# Patient Record
Sex: Male | Born: 1995 | Race: White | Hispanic: No | Marital: Single | Smoking: Never smoker
Health system: Southern US, Community
[De-identification: ages and names within clinical notes are randomized; demographics above are authoritative.]

## PROBLEM LIST (undated history)

## (undated) DIAGNOSIS — K219 Gastro-esophageal reflux disease without esophagitis: Secondary | ICD-10-CM

## (undated) DIAGNOSIS — S83511A Sprain of anterior cruciate ligament of right knee, initial encounter: Secondary | ICD-10-CM

## (undated) HISTORY — PX: ROOT CANAL: SHX2363

---
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
## 2016-01-22 DIAGNOSIS — H5213 Myopia, bilateral: Secondary | ICD-10-CM | POA: Diagnosis not present

## 2016-06-07 DIAGNOSIS — J029 Acute pharyngitis, unspecified: Secondary | ICD-10-CM | POA: Diagnosis not present

## 2016-06-07 DIAGNOSIS — J101 Influenza due to other identified influenza virus with other respiratory manifestations: Secondary | ICD-10-CM | POA: Diagnosis not present

## 2016-06-26 DIAGNOSIS — S838X1A Sprain of other specified parts of right knee, initial encounter: Secondary | ICD-10-CM | POA: Diagnosis not present

## 2016-06-30 DIAGNOSIS — S83511A Sprain of anterior cruciate ligament of right knee, initial encounter: Secondary | ICD-10-CM

## 2016-06-30 HISTORY — DX: Sprain of anterior cruciate ligament of right knee, initial encounter: S83.511A

## 2016-07-01 DIAGNOSIS — M546 Pain in thoracic spine: Secondary | ICD-10-CM | POA: Diagnosis not present

## 2016-07-02 DIAGNOSIS — M546 Pain in thoracic spine: Secondary | ICD-10-CM | POA: Diagnosis not present

## 2016-07-03 ENCOUNTER — Encounter (HOSPITAL_BASED_OUTPATIENT_CLINIC_OR_DEPARTMENT_OTHER): Payer: Self-pay | Admitting: *Deleted

## 2016-07-06 ENCOUNTER — Ambulatory Visit: Payer: Self-pay | Admitting: Physician Assistant

## 2016-07-06 NOTE — H&P (Signed)
Dylan Peterson is an 20 y.o. male.   Chief Complaint: right knee ACL tear HPI: patient was running down the basketball court on 06/25/16 felt a pop and had immediate pain was seen in orthopaedic urgent care mri ordered.  MRI confirms complete tear of the ACL with questionable meniscal pathology laterally.  Past Medical History:  Diagnosis Date  . GERD (gastroesophageal reflux disease)    no current med.  . Right ACL tear 06/2016    Past Surgical History:  Procedure Laterality Date  . ARM HARDWARE REMOVAL Right 01/18/2006   humerus  . CLOSED REDUCTION AND PERCUTANEOUS PINNING OF HUMERUS FRACTURE Right 12/20/2005  . INGUINAL HERNIA REPAIR Right 08/03/2001    Family History  Problem Relation Age of Onset  . Alcohol abuse Mother   . Drug abuse Mother   . Depression Mother   . Alcohol abuse Paternal Uncle   . Prostate cancer Maternal Grandfather   . Hypertension Maternal Grandfather   . Diabetes Maternal Grandfather    Social History:  reports that he has never smoked. He has never used smokeless tobacco. He reports that he does not drink alcohol or use drugs.  Allergies: No Known Allergies   (Not in a hospital admission)  No results found for this or any previous visit (from the past 48 hour(s)). No results found.  Review of Systems  Gastrointestinal: Positive for heartburn.  Musculoskeletal: Positive for falls and joint pain.  All other systems reviewed and are negative.   There were no vitals taken for this visit. Physical Exam  Constitutional: He is oriented to person, place, and time. He appears well-developed and well-nourished. No distress.  HENT:  Head: Normocephalic and atraumatic.  Nose: Nose normal.  Eyes: Conjunctivae and EOM are normal. Pupils are equal, round, and reactive to light.  Neck: Normal range of motion. Neck supple.  Cardiovascular: Normal rate and intact distal pulses.   Respiratory: Effort normal. No respiratory distress.  GI: Soft. He  exhibits no distension. There is no tenderness.  Musculoskeletal:       Right knee: He exhibits swelling and effusion. He exhibits no erythema, no LCL laxity and no MCL laxity. Tenderness found.  + lachman,   Neurological: He is alert and oriented to person, place, and time.  Skin: Skin is warm and dry. No rash noted. No erythema.  Psychiatric: He has a normal mood and affect. His behavior is normal.     Assessment/Plan He is a candidate for an autograft reconstruction. I told him my experience with using the patellar tendon. 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 He will need a physical therapy referral after surgery. He will need a CPM. This will be performed as an outpatient with a femoral nerve block. Risks and benefits discussed in general with the patient who is an adult with one of his parents present. Proceed on with scheduling as soon as practicable. He is totally disabled at this time.   Devaun Hernandez, PA-C 07/06/2016, 12:18 PM   

## 2016-07-08 ENCOUNTER — Encounter (HOSPITAL_BASED_OUTPATIENT_CLINIC_OR_DEPARTMENT_OTHER): Payer: Self-pay | Admitting: *Deleted

## 2016-07-08 ENCOUNTER — Ambulatory Visit (HOSPITAL_BASED_OUTPATIENT_CLINIC_OR_DEPARTMENT_OTHER): Payer: 59 | Admitting: Anesthesiology

## 2016-07-08 ENCOUNTER — Encounter (HOSPITAL_BASED_OUTPATIENT_CLINIC_OR_DEPARTMENT_OTHER)
Admission: RE | Disposition: A | Discharge status: Home / Self Care | Payer: Self-pay | Source: Ambulatory Visit | Attending: Orthopedic Surgery

## 2016-07-08 ENCOUNTER — Ambulatory Visit (HOSPITAL_BASED_OUTPATIENT_CLINIC_OR_DEPARTMENT_OTHER)
Admission: RE | Admit: 2016-07-08 | Discharge: 2016-07-08 | Disposition: A | Discharge status: Home / Self Care | Payer: 59 | Source: Ambulatory Visit | Attending: Orthopedic Surgery | Admitting: Orthopedic Surgery

## 2016-07-08 DIAGNOSIS — S83511A Sprain of anterior cruciate ligament of right knee, initial encounter: Secondary | ICD-10-CM | POA: Diagnosis not present

## 2016-07-08 DIAGNOSIS — S83512A Sprain of anterior cruciate ligament of left knee, initial encounter: Secondary | ICD-10-CM | POA: Diagnosis not present

## 2016-07-08 DIAGNOSIS — Y9302 Activity, running: Secondary | ICD-10-CM | POA: Diagnosis not present

## 2016-07-08 DIAGNOSIS — M25572 Pain in left ankle and joints of left foot: Secondary | ICD-10-CM | POA: Diagnosis not present

## 2016-07-08 DIAGNOSIS — M545 Low back pain: Secondary | ICD-10-CM | POA: Diagnosis not present

## 2016-07-08 DIAGNOSIS — Y92310 Basketball court as the place of occurrence of the external cause: Secondary | ICD-10-CM | POA: Insufficient documentation

## 2016-07-08 DIAGNOSIS — G8918 Other acute postprocedural pain: Secondary | ICD-10-CM | POA: Diagnosis not present

## 2016-07-08 HISTORY — DX: Sprain of anterior cruciate ligament of right knee, initial encounter: S83.511A

## 2016-07-08 HISTORY — DX: Gastro-esophageal reflux disease without esophagitis: K21.9

## 2016-07-08 HISTORY — PX: KNEE ARTHROSCOPY WITH ANTERIOR CRUCIATE LIGAMENT (ACL) REPAIR: SHX5644

## 2016-07-08 SURGERY — KNEE ARTHROSCOPY WITH ANTERIOR CRUCIATE LIGAMENT (ACL) REPAIR
Anesthesia: General | Site: Knee | Laterality: Right

## 2016-07-08 MED ORDER — OXYCODONE-ACETAMINOPHEN 5-325 MG PO TABS
ORAL_TABLET | ORAL | Status: AC
  Filled 2016-07-08: qty 1

## 2016-07-08 MED ORDER — BUPIVACAINE-EPINEPHRINE (PF) 0.5% -1:200000 IJ SOLN
INTRAMUSCULAR | Status: DC | PRN
  Administered 2016-07-08: 25 mL via PERINEURAL

## 2016-07-08 MED ORDER — DEXAMETHASONE SODIUM PHOSPHATE 4 MG/ML IJ SOLN
INTRAMUSCULAR | Status: DC | PRN
  Administered 2016-07-08: 10 mg via INTRAVENOUS

## 2016-07-08 MED ORDER — LIDOCAINE 2% (20 MG/ML) 5 ML SYRINGE
INTRAMUSCULAR | Status: AC
  Filled 2016-07-08: qty 5

## 2016-07-08 MED ORDER — LIDOCAINE 2% (20 MG/ML) 5 ML SYRINGE
INTRAMUSCULAR | Status: DC | PRN
  Administered 2016-07-08: 20 mg via INTRAVENOUS

## 2016-07-08 MED ORDER — HYDROMORPHONE HCL 1 MG/ML IJ SOLN
0.2500 mg | INTRAMUSCULAR | Status: DC | PRN
  Administered 2016-07-08 (×2): 0.5 mg via INTRAVENOUS

## 2016-07-08 MED ORDER — CHLORHEXIDINE GLUCONATE 4 % EX LIQD: 60.0000 mL | Freq: Once | CUTANEOUS | Status: DC

## 2016-07-08 MED ORDER — PROMETHAZINE HCL 25 MG/ML IJ SOLN: 6.2500 mg | INTRAMUSCULAR | Status: DC | PRN

## 2016-07-08 MED ORDER — SODIUM CHLORIDE 0.9 % IR SOLN
Status: DC | PRN
  Administered 2016-07-08: 16000 mL

## 2016-07-08 MED ORDER — SODIUM CHLORIDE 0.9 % IV SOLN: INTRAVENOUS | Status: DC

## 2016-07-08 MED ORDER — ONDANSETRON HCL 4 MG/2ML IJ SOLN
INTRAMUSCULAR | Status: AC
  Filled 2016-07-08: qty 2

## 2016-07-08 MED ORDER — HYDROMORPHONE HCL 1 MG/ML IJ SOLN
INTRAMUSCULAR | Status: AC
  Filled 2016-07-08: qty 1

## 2016-07-08 MED ORDER — FENTANYL CITRATE (PF) 100 MCG/2ML IJ SOLN
50.0000 ug | INTRAMUSCULAR | Status: AC | PRN
  Administered 2016-07-08 (×3): 50 ug via INTRAVENOUS
  Administered 2016-07-08 (×2): 25 ug via INTRAVENOUS
  Administered 2016-07-08: 100 ug via INTRAVENOUS

## 2016-07-08 MED ORDER — ONDANSETRON HCL 4 MG/2ML IJ SOLN
INTRAMUSCULAR | Status: DC | PRN
  Administered 2016-07-08: 4 mg via INTRAVENOUS

## 2016-07-08 MED ORDER — PROPOFOL 10 MG/ML IV BOLUS
INTRAVENOUS | Status: DC | PRN
  Administered 2016-07-08: 200 mg via INTRAVENOUS

## 2016-07-08 MED ORDER — OXYCODONE-ACETAMINOPHEN 5-325 MG PO TABS
1.0000 | ORAL_TABLET | Freq: Once | ORAL | Status: AC
Start: 2016-07-08 — End: 2016-07-08
  Administered 2016-07-08: 1 via ORAL

## 2016-07-08 MED ORDER — MIDAZOLAM HCL 2 MG/2ML IJ SOLN
INTRAMUSCULAR | Status: AC
  Filled 2016-07-08: qty 2

## 2016-07-08 MED ORDER — FENTANYL CITRATE (PF) 100 MCG/2ML IJ SOLN
INTRAMUSCULAR | Status: AC
  Filled 2016-07-08: qty 2

## 2016-07-08 MED ORDER — CEFAZOLIN SODIUM-DEXTROSE 2-4 GM/100ML-% IV SOLN
INTRAVENOUS | Status: AC
  Filled 2016-07-08: qty 100

## 2016-07-08 MED ORDER — MIDAZOLAM HCL 2 MG/2ML IJ SOLN
1.0000 mg | INTRAMUSCULAR | Status: DC | PRN
  Administered 2016-07-08 (×2): 2 mg via INTRAVENOUS

## 2016-07-08 MED ORDER — PROPOFOL 10 MG/ML IV BOLUS
INTRAVENOUS | Status: AC
  Filled 2016-07-08: qty 40

## 2016-07-08 MED ORDER — SCOPOLAMINE 1 MG/3DAYS TD PT72: 1.0000 | MEDICATED_PATCH | Freq: Once | TRANSDERMAL | Status: DC | PRN

## 2016-07-08 MED ORDER — CEFAZOLIN SODIUM-DEXTROSE 2-4 GM/100ML-% IV SOLN
2.0000 g | INTRAVENOUS | Status: AC
  Administered 2016-07-08: 2 g via INTRAVENOUS

## 2016-07-08 MED ORDER — FENTANYL CITRATE (PF) 100 MCG/2ML IJ SOLN
INTRAMUSCULAR | Status: AC
Start: 2016-07-08 — End: 2016-07-08
  Filled 2016-07-08: qty 2

## 2016-07-08 MED ORDER — LACTATED RINGERS IV SOLN
INTRAVENOUS | Status: DC
  Administered 2016-07-08 (×3): via INTRAVENOUS

## 2016-07-08 SURGICAL SUPPLY — 91 items
ANCH SUT PUSHLCK 19.5X3.5 STRL (Anchor) ×1 IMPLANT
ANCHOR PUSHLOCK PEEK 3.5X19.5 (Anchor) ×2 IMPLANT
APL SKNCLS STERI-STRIP NONHPOA (GAUZE/BANDAGES/DRESSINGS) ×1
BANDAGE ACE 4X5 VEL STRL LF (GAUZE/BANDAGES/DRESSINGS) ×3 IMPLANT
BANDAGE ACE 6X5 VEL STRL LF (GAUZE/BANDAGES/DRESSINGS) ×3 IMPLANT
BANDAGE ESMARK 6X9 LF (GAUZE/BANDAGES/DRESSINGS) IMPLANT
BENZOIN TINCTURE PRP APPL 2/3 (GAUZE/BANDAGES/DRESSINGS) ×3 IMPLANT
BLADE 4.2CUDA (BLADE) ×3 IMPLANT
BLADE AVERAGE 25MMX9MM (BLADE)
BLADE AVERAGE 25X9 (BLADE) IMPLANT
BLADE CUDA 5.5 (BLADE) IMPLANT
BLADE CUDA GRT WHITE 3.5 (BLADE) IMPLANT
BLADE CUDA SHAVER 3.5 (BLADE) IMPLANT
BLADE CUTTER MENIS 5.5 (BLADE) IMPLANT
BLADE GREAT WHITE 4.2 (BLADE) IMPLANT
BLADE GREAT WHITE 4.2MM (BLADE)
BLADE OSCIL/SAGITTAL W/10 ST (BLADE) ×1 IMPLANT
BLADE OSCIL/SAGITTAL W/10MM ST (BLADE) ×1
BLADE SURG 10 STRL SS (BLADE) ×3 IMPLANT
BLADE SURG 15 STRL LF DISP TIS (BLADE) ×1 IMPLANT
BLADE SURG 15 STRL SS (BLADE) ×3
BNDG CMPR 9X6 STRL LF SNTH (GAUZE/BANDAGES/DRESSINGS) ×1
BNDG ESMARK 6X9 LF (GAUZE/BANDAGES/DRESSINGS) ×3
BUR EGG 3PK/BX (BURR) ×2 IMPLANT
BUR VERTEX HOODED 4.5 (BURR) ×3 IMPLANT
CLOSURE WOUND 1/2 X4 (GAUZE/BANDAGES/DRESSINGS) ×1
COVER BACK TABLE 60X90IN (DRAPES) ×3 IMPLANT
CUFF TOURNIQUET SINGLE 34IN LL (TOURNIQUET CUFF) ×1 IMPLANT
CUTTER MENISCUS  4.2MM (BLADE)
CUTTER MENISCUS 4.2MM (BLADE) IMPLANT
DRAPE ARTHROSCOPY W/POUCH 114 (DRAPES) ×3 IMPLANT
DRAPE IMP U-DRAPE 54X76 (DRAPES) ×2 IMPLANT
DRAPE INCISE IOBAN 66X45 STRL (DRAPES) ×3 IMPLANT
DRSG EMULSION OIL 3X3 NADH (GAUZE/BANDAGES/DRESSINGS) ×6 IMPLANT
DURAPREP 26ML APPLICATOR (WOUND CARE) ×3 IMPLANT
ELECT REM PT RETURN 9FT ADLT (ELECTROSURGICAL) ×3
ELECTRODE REM PT RTRN 9FT ADLT (ELECTROSURGICAL) IMPLANT
GAUZE SPONGE 4X4 12PLY STRL (GAUZE/BANDAGES/DRESSINGS) ×3 IMPLANT
GLOVE BIO SURGEON STRL SZ7.5 (GLOVE) ×6 IMPLANT
GLOVE BIOGEL PI IND STRL 7.0 (GLOVE) IMPLANT
GLOVE BIOGEL PI IND STRL 8 (GLOVE) ×3 IMPLANT
GLOVE BIOGEL PI INDICATOR 7.0 (GLOVE) ×2
GLOVE BIOGEL PI INDICATOR 8 (GLOVE) ×6
GLOVE ECLIPSE 6.5 STRL STRAW (GLOVE) ×2 IMPLANT
GLOVE SURG ORTHO 8.0 STRL STRW (GLOVE) ×3 IMPLANT
GLOVE SURG SS PI 6.5 STRL IVOR (GLOVE) ×2 IMPLANT
GOWN STRL REUS W/ TWL LRG LVL3 (GOWN DISPOSABLE) ×1 IMPLANT
GOWN STRL REUS W/ TWL XL LVL3 (GOWN DISPOSABLE) ×2 IMPLANT
GOWN STRL REUS W/TWL LRG LVL3 (GOWN DISPOSABLE) ×3
GOWN STRL REUS W/TWL XL LVL3 (GOWN DISPOSABLE) ×6
IMMOBILIZER KNEE 22 UNIV (SOFTGOODS) IMPLANT
IMMOBILIZER KNEE 24 THIGH 36 (MISCELLANEOUS) IMPLANT
IMMOBILIZER KNEE 24 UNIV (MISCELLANEOUS) ×3
KIT TRANSTIBIAL (DISPOSABLE) ×1 IMPLANT
KNEE WRAP E Z 3 GEL PACK (MISCELLANEOUS) ×3 IMPLANT
KNIFE GRAFT ACL 10MM 5952 (MISCELLANEOUS) IMPLANT
MANIFOLD NEPTUNE II (INSTRUMENTS) ×3 IMPLANT
NDL SAFETY ECLIPSE 18X1.5 (NEEDLE) ×1 IMPLANT
NEEDLE HYPO 18GX1.5 SHARP (NEEDLE) ×3
PACK ARTHROSCOPY DSU (CUSTOM PROCEDURE TRAY) ×3 IMPLANT
PACK BASIN DAY SURGERY FS (CUSTOM PROCEDURE TRAY) ×3 IMPLANT
PASSER SUT SWANSON 36MM LOOP (INSTRUMENTS) IMPLANT
PENCIL BUTTON HOLSTER BLD 10FT (ELECTRODE) ×2 IMPLANT
PROBE BIPOLAR ARTHRO 85MM 30D (MISCELLANEOUS) IMPLANT
PROBE BIPOLAR ATHRO 135MM 90D (MISCELLANEOUS) ×2 IMPLANT
SCREW INTERFERENCE 7X25MM (Screw) ×2 IMPLANT
SCREW INTERFERENCE 8X20MM (Screw) ×2 IMPLANT
SET ARTHROSCOPY TUBING (MISCELLANEOUS) ×3
SET ARTHROSCOPY TUBING LN (MISCELLANEOUS) ×1 IMPLANT
SPONGE LAP 4X18 X RAY DECT (DISPOSABLE) ×3 IMPLANT
STRIP CLOSURE SKIN 1/2X4 (GAUZE/BANDAGES/DRESSINGS) ×2 IMPLANT
SUCTION FRAZIER HANDLE 10FR (MISCELLANEOUS) ×2
SUCTION TUBE FRAZIER 10FR DISP (MISCELLANEOUS) ×1 IMPLANT
SUT 2 FIBERLOOP 20 STRT BLUE (SUTURE)
SUT BONE WAX W31G (SUTURE) IMPLANT
SUT FIBERWIRE #2 38 T-5 BLUE (SUTURE) ×12
SUT MNCRL AB 3-0 PS2 18 (SUTURE) ×3 IMPLANT
SUT PDS 2 CP NEEDLE XSPECIAL (SUTURE) ×6 IMPLANT
SUT VIC AB 0 CT1 27 (SUTURE)
SUT VIC AB 0 CT1 27XBRD ANBCTR (SUTURE) IMPLANT
SUT VIC AB 2-0 CT1 27 (SUTURE) ×3
SUT VIC AB 2-0 CT1 TAPERPNT 27 (SUTURE) ×1 IMPLANT
SUT VIC AB 3-0 SH 27 (SUTURE)
SUT VIC AB 3-0 SH 27X BRD (SUTURE) IMPLANT
SUTURE 2 FIBERLOOP 20 STRT BLU (SUTURE) IMPLANT
SUTURE FIBERWR #2 38 T-5 BLUE (SUTURE) IMPLANT
SYR 5ML LL (SYRINGE) ×3 IMPLANT
TOWEL OR 17X24 6PK STRL BLUE (TOWEL DISPOSABLE) ×6 IMPLANT
TOWEL OR NON WOVEN STRL DISP B (DISPOSABLE) ×3 IMPLANT
WATER STERILE IRR 1000ML POUR (IV SOLUTION) ×3 IMPLANT
YANKAUER SUCT BULB TIP NO VENT (SUCTIONS) ×3 IMPLANT

## 2016-07-08 NOTE — Interval H&P Note (Signed)
History and Physical Interval Note:  07/08/2016 1:16 PM  Dylan Peterson  has presented today for surgery, with the diagnosis of right knee acl tear  The various methods of treatment have been discussed with the patient and family. After consideration of risks, benefits and other options for treatment, the patient has consented to  Procedure(s): RIGHT KNEE ARTHROSCOPY WITH ANTERIOR CRUCIATE LIGAMENT (ACL) REPAIR (Right) as a surgical intervention .  The patient's history has been reviewed, patient examined, no change in status, stable for surgery.  I have reviewed the patient's chart and labs.  Questions were answered to the patient's satisfaction.     Angeleah Labrake JR,W D

## 2016-07-08 NOTE — Brief Op Note (Signed)
07/08/2016  3:37 PM  PATIENT:  Ave Filterhandler T Olaes  21 y.o. male  PRE-OPERATIVE DIAGNOSIS:  right knee acl tear  POST-OPERATIVE DIAGNOSIS:  right knee acl tear  PROCEDURE:  Procedure(s): RIGHT KNEE ARTHROSCOPY WITH ANTERIOR CRUCIATE LIGAMENT (ACL) REPAIR with patellar tendon autograft (Right)  SURGEON:  Surgeon(s) and Role:    Frederico Hamman* Caffrey, Daniel, MD - Primary  PHYSICIAN ASSISTANT: Margart SicklesJoshua Clairissa Valvano, PA-C  ASSISTANTS:   ANESTHESIA:   regional and general  EBL:  Total I/O In: 1600 [I.V.:1600] Out: 100 [Blood:100]  BLOOD ADMINISTERED:none  DRAINS: none   LOCAL MEDICATIONS USED:  NONE  SPECIMEN:  No Specimen  DISPOSITION OF SPECIMEN:  N/A  COUNTS:  YES  TOURNIQUET:  * No tourniquets in log *  DICTATION: .Other Dictation: Dictation Number unknown  PLAN OF CARE: Discharge to home after PACU  PATIENT DISPOSITION:  PACU - hemodynamically stable.   Delay start of Pharmacological VTE agent (>24hrs) due to surgical blood loss or risk of bleeding: not applicable

## 2016-07-08 NOTE — Transfer of Care (Signed)
Immediate Anesthesia Transfer of Care Note  Patient: Dylan Peterson  Procedure(s) Performed: Procedure(s): RIGHT KNEE ARTHROSCOPY WITH ANTERIOR CRUCIATE LIGAMENT (ACL) REPAIR with patellar tendon autograft (Right)  Patient Location: PACU  Anesthesia Type:GA combined with regional for post-op pain  Level of Consciousness: sedated  Airway & Oxygen Therapy: Patient Spontanous Breathing and Patient connected to face mask oxygen  Post-op Assessment: Report given to RN and Post -op Vital signs reviewed and stable  Post vital signs: Reviewed and stable  Last Vitals:  Vitals:   07/08/16 1200 07/08/16 1543  BP: (!) 114/59   Pulse: 69   Resp: 16   Temp:  (P) 36.4 C    Last Pain:  Vitals:   07/08/16 1543  TempSrc:   PainSc: (P) Asleep         Complications: No apparent anesthesia complications

## 2016-07-08 NOTE — Anesthesia Procedure Notes (Addendum)
Anesthesia Regional Block: Femoral nerve block   Pre-Anesthetic Checklist: ,, timeout performed, Correct Patient, Correct Site, Correct Laterality, Correct Procedure, Correct Position, site marked, Risks and benefits discussed,  Surgical consent,  Pre-op evaluation,  At surgeon's request and post-op pain management  Laterality: Right  Prep: chloraprep       Needles:  Injection technique: Single-shot  Needle Type: Echogenic Stimulator Needle     Needle Length: 9cm  Needle Gauge: 21     Additional Needles:   Procedures: ultrasound guided,,,,,,,,   Nerve Stimulator or Paresthesia:  Response: quad, 0.5 mA,   Additional Responses:   Narrative:  Start time: 07/08/2016 11:45 AM End time: 07/08/2016 11:52 AM Injection made incrementally with aspirations every 5 mL.  Performed by: Personally  Anesthesiologist: Marcene DuosFITZGERALD, Wolfe Camarena

## 2016-07-08 NOTE — Anesthesia Procedure Notes (Signed)
Procedure Name: LMA Insertion Date/Time: 07/08/2016 1:29 PM Performed by: Tyrone NineSAUVE, Quintus Premo F Pre-anesthesia Checklist: Patient identified, Timeout performed, Emergency Drugs available, Suction available and Patient being monitored Patient Re-evaluated:Patient Re-evaluated prior to inductionOxygen Delivery Method: Circle system utilized Preoxygenation: Pre-oxygenation with 100% oxygen Intubation Type: IV induction Ventilation: Mask ventilation without difficulty LMA: LMA inserted LMA Size: 4.0 Number of attempts: 1 Placement Confirmation: breath sounds checked- equal and bilateral and positive ETCO2 Tube secured with: Tape Dental Injury: Teeth and Oropharynx as per pre-operative assessment

## 2016-07-08 NOTE — Progress Notes (Signed)
Assisted Dr. Rob Fitzgerald with right, ultrasound guided, femoral block. Side rails up, monitors on throughout procedure. See vital signs in flow sheet. Tolerated Procedure well. 

## 2016-07-08 NOTE — Anesthesia Postprocedure Evaluation (Signed)
Anesthesia Post Note  Patient: Dylan Peterson  Procedure(s) Performed: Procedure(s) (LRB): RIGHT KNEE ARTHROSCOPY WITH ANTERIOR CRUCIATE LIGAMENT (ACL) REPAIR with patellar tendon autograft (Right)  Patient location during evaluation: PACU Anesthesia Type: General Level of consciousness: awake and alert Pain management: pain level controlled Vital Signs Assessment: post-procedure vital signs reviewed and stable Respiratory status: spontaneous breathing, nonlabored ventilation, respiratory function stable and patient connected to nasal cannula oxygen Cardiovascular status: blood pressure returned to baseline and stable Postop Assessment: no signs of nausea or vomiting Anesthetic complications: no       Last Vitals:  Vitals:   07/08/16 1630 07/08/16 1639  BP: 123/64 126/70  Pulse: 60 (!) 55  Resp: 15 16  Temp:  36.6 C    Last Pain:  Vitals:   07/08/16 1639  TempSrc: Oral  PainSc: 5                  Kennieth RadFitzgerald, Melisse Caetano E

## 2016-07-08 NOTE — Anesthesia Preprocedure Evaluation (Addendum)
Anesthesia Evaluation  Patient identified by MRN, date of birth, ID band Patient awake    Reviewed: Allergy & Precautions, NPO status , Patient's Chart, lab work & pertinent test results  Airway Mallampati: II  TM Distance: >3 FB Neck ROM: Full    Dental  (+) Dental Advisory Given, Teeth Intact   Pulmonary neg pulmonary ROS,    breath sounds clear to auscultation       Cardiovascular Exercise Tolerance: Good negative cardio ROS   Rhythm:Regular Rate:Normal     Neuro/Psych negative neurological ROS  negative psych ROS   GI/Hepatic negative GI ROS, Neg liver ROS,   Endo/Other  negative endocrine ROS  Renal/GU negative Renal ROS  negative genitourinary   Musculoskeletal Right knee ACL tear   Abdominal   Peds negative pediatric ROS (+)  Hematology negative hematology ROS (+)   Anesthesia Other Findings   Reproductive/Obstetrics negative OB ROS                          Anesthesia Physical Anesthesia Plan  ASA: I  Anesthesia Plan: General   Post-op Pain Management:  Regional for Post-op pain   Induction: Intravenous  Airway Management Planned: LMA  Additional Equipment:   Intra-op Plan:   Post-operative Plan: Extubation in OR  Informed Consent: I have reviewed the patients History and Physical, chart, labs and discussed the procedure including the risks, benefits and alternatives for the proposed anesthesia with the patient or authorized representative who has indicated his/her understanding and acceptance.   Dental advisory given  Plan Discussed with: CRNA  Anesthesia Plan Comments:        Anesthesia Quick Evaluation

## 2016-07-08 NOTE — H&P (View-Only) (Signed)
Dylan Peterson is an 21 y.o. male.   Chief Complaint: right knee ACL tear HPI: patient was running down the basketball court on 06/25/16 felt a pop and had immediate pain was seen in orthopaedic urgent care mri ordered.  MRI confirms complete tear of the ACL with questionable meniscal pathology laterally.  Past Medical History:  Diagnosis Date  . GERD (gastroesophageal reflux disease)    no current med.  . Right ACL tear 06/2016    Past Surgical History:  Procedure Laterality Date  . ARM HARDWARE REMOVAL Right 01/18/2006   humerus  . CLOSED REDUCTION AND PERCUTANEOUS PINNING OF HUMERUS FRACTURE Right 12/20/2005  . INGUINAL HERNIA REPAIR Right 08/03/2001    Family History  Problem Relation Age of Onset  . Alcohol abuse Mother   . Drug abuse Mother   . Depression Mother   . Alcohol abuse Paternal Uncle   . Prostate cancer Maternal Grandfather   . Hypertension Maternal Grandfather   . Diabetes Maternal Grandfather    Social History:  reports that he has never smoked. He has never used smokeless tobacco. He reports that he does not drink alcohol or use drugs.  Allergies: No Known Allergies   (Not in a hospital admission)  No results found for this or any previous visit (from the past 48 hour(s)). No results found.  Review of Systems  Gastrointestinal: Positive for heartburn.  Musculoskeletal: Positive for falls and joint pain.  All other systems reviewed and are negative.   There were no vitals taken for this visit. Physical Exam  Constitutional: He is oriented to person, place, and time. He appears well-developed and well-nourished. No distress.  HENT:  Head: Normocephalic and atraumatic.  Nose: Nose normal.  Eyes: Conjunctivae and EOM are normal. Pupils are equal, round, and reactive to light.  Neck: Normal range of motion. Neck supple.  Cardiovascular: Normal rate and intact distal pulses.   Respiratory: Effort normal. No respiratory distress.  GI: Soft. He  exhibits no distension. There is no tenderness.  Musculoskeletal:       Right knee: He exhibits swelling and effusion. He exhibits no erythema, no LCL laxity and no MCL laxity. Tenderness found.  + lachman,   Neurological: He is alert and oriented to person, place, and time.  Skin: Skin is warm and dry. No rash noted. No erythema.  Psychiatric: He has a normal mood and affect. His behavior is normal.     Assessment/Plan He is a candidate for an autograft reconstruction. I told him my experience with using the patellar tendon. 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 He will need a physical therapy referral after surgery. He will need a CPM. This will be performed as an outpatient with a femoral nerve block. Risks and benefits discussed in general with the patient who is an adult with one of his parents present. Proceed on with scheduling as soon as practicable. He is totally disabled at this time.   Margart SicklesChadwell, Janice Bodine, PA-C 07/06/2016, 12:18 PM

## 2016-07-08 NOTE — Discharge Instructions (Signed)
Diet: As you were doing prior to hospitalization   Activity: Increase activity slowly as tolerated  No lifting or driving for 6 weeks  May come out of knee immobilizer range of motion as tolerated except when walking.  Shower: May shower without a dressing on post op day #3, NO SOAKING in tub   Dressing: You may change your dressing on post op day #3.  Then change the dressing daily with sterile 4"x4"s gauze dressing    Weight Bearing: weight bearing as tolerated in knee immobilizer.  To prevent constipation: you may use a stool softener such as -  Colace ( over the counter) 100 mg by mouth twice a day  Drink plenty of fluids ( prune juice may be helpful) and high fiber foods  Miralax ( over the counter) for constipation as needed.   Precautions: If you experience chest pain or shortness of breath - call 911 immediately For transfer to the hospital emergency department!!  If you develop a fever greater that 101 F, purulent drainage from wound, increased redness or drainage from wound, or calf pain -- Call the office   Follow- Up Appointment: Please call for an appointment to be seen in 1 week  [place] - [phone]   Post Anesthesia Home Care Instructions  Activity: Get plenty of rest for the remainder of the day. A responsible individual must stay with you for 24 hours following the procedure.  For the next 24 hours, DO NOT: -Drive a car -Advertising copywriter -Drink alcoholic beverages -Take any medication unless instructed by your physician -Make any legal decisions or sign important papers.  Meals: Start with liquid foods such as gelatin or soup. Progress to regular foods as tolerated. Avoid greasy, spicy, heavy foods. If nausea and/or vomiting occur, drink only clear liquids until the nausea and/or vomiting subsides. Call your physician if vomiting continues.  Special Instructions/Symptoms: Your throat may feel dry or sore from the anesthesia or the breathing tube  placed in your throat during surgery. If this causes discomfort, gargle with warm salt water. The discomfort should disappear within 24 hours.  If you had a scopolamine patch placed behind your ear for the management of post- operative nausea and/or vomiting:  1. The medication in the patch is effective for 72 hours, after which it should be removed.  Wrap patch in a tissue and discard in the trash. Wash hands thoroughly with soap and water. 2. You may remove the patch earlier than 72 hours if you experience unpleasant side effects which may include dry mouth, dizziness or visual disturbances. 3. Avoid touching the patch. Wash your hands with soap and water after contact with the patch.   Regional Anesthesia Blocks  1. Numbness or the inability to move the "blocked" extremity may last from 3-48 hours after placement. The length of time depends on the medication injected and your individual response to the medication. If the numbness is not going away after 48 hours, call your surgeon.  2. The extremity that is blocked will need to be protected until the numbness is gone and the  Strength has returned. Because you cannot feel it, you will need to take extra care to avoid injury. Because it may be weak, you may have difficulty moving it or using it. You may not know what position it is in without looking at it while the block is in effect.  3. For blocks in the legs and feet, returning to weight bearing and walking needs to be done carefully.  You will need to wait until the numbness is entirely gone and the strength has returned. You should be able to move your leg and foot normally before you try and bear weight or walk. You will need someone to be with you when you first try to ensure you do not fall and possibly risk injury.  4. Bruising and tenderness at the needle site are common side effects and will resolve in a few days.  5. Persistent numbness or new problems with movement should be  communicated to the surgeon or the [facility] [phone])/ [facility] [phone]).

## 2016-07-09 ENCOUNTER — Encounter (HOSPITAL_BASED_OUTPATIENT_CLINIC_OR_DEPARTMENT_OTHER): Payer: Self-pay | Admitting: Orthopedic Surgery

## 2016-07-09 DIAGNOSIS — M23612 Other spontaneous disruption of anterior cruciate ligament of left knee: Secondary | ICD-10-CM | POA: Diagnosis not present

## 2016-07-09 NOTE — Op Note (Signed)
NAME:  ,                                 ACCOUNT NO.:  MEDICAL RECORD NO.:  [id number]  LOCATION:                                 FACILITY:  PHYSICIAN:  Dyke BrackettW. D. Divina Neale, M.D.         DATE OF BIRTH:  DATE OF PROCEDURE:  07/08/2016 DATE OF DISCHARGE:                              OPERATIVE REPORT   INDICATIONS:  A 21 year old isolated ACL tear thought to be amenable to surgery.  PREOPERATIVE DIAGNOSIS:  Complete anterior cruciate ligament tear, right knee.  POSTOPERATIVE DIAGNOSIS:  Complete anterior cruciate ligament tear, right knee.  OPERATION:  Bone-tendon-bone autograft reconstruction, right knee (Arthrex 25 x 7 mm screw, femur; 8 x 20 screw, tibia with additional fixation of 3.5 PushLock on tibia).  SURGEON:  Dyke BrackettW. D. Steadman Prosperi, M.D.  ASSISTANT:  Margart SicklesJoshua Chadwell, PA-C.  ANESTHESIA:  Femoral nerve block, general anesthetic.  DESCRIPTION OF PROCEDURE:  No tourniquet.  Examination under anesthesia showed a 4+ pivot shift and Lachman.  He was arthroscoped through an inferior medial and inferior lateral portal.  Intra-articular examination confirmed a complete ACL tear with absence of any chondral meniscal pathology.  We harvested a 115 mm long autograft tendon with a 25 mm bone plug placed on the femoral side at 20 mm on the tibial side. We harvested bone graft as well to place in the patellar defect for closure later.  I then placed a guide pin in the anatomic attachment side at approximately 50-55 mm tunnel length of the tibia.  Over reamed this to 10 mm, followed by 10 mm reaming in the 11 o'clock position for the right knee, confirmed the tunnel to be well within bone with good posterior wall.  We then passed the graft from tibial through femoral tunnels and fixed it with metal screws, specifically a 25 x 7 mm screw on the femur and an 8 x 20 on the tibia.  There was slight overhang of a portion of the graft on the tibia.  We elected to back the tibial screw up with a  PushLock placed through 1 of the sutures through the drill hole on the patellar bone plug.  Lachman which was 4+ was trace to negligible to 1+ max.  There was no evidence of graft impingement as well.  Closure was affected after bone grafting the patella with 0 Vicryl, 2-0 Vicryl, and Monocryl.  Lightly compressive sterile dressing, knee immobilizer applied.  Taken to recovery room in stable condition.     Dyke BrackettW. D. Ekin Pilar, M.D.    WDC/MEDQ  D:  07/08/2016  T:  07/09/2016  Job:  [id number]

## 2016-07-13 ENCOUNTER — Encounter: Payer: Self-pay | Admitting: Physical Therapy

## 2016-07-13 ENCOUNTER — Ambulatory Visit: Payer: 59 | Attending: Orthopedic Surgery | Admitting: Physical Therapy

## 2016-07-13 DIAGNOSIS — M25661 Stiffness of right knee, not elsewhere classified: Secondary | ICD-10-CM | POA: Diagnosis not present

## 2016-07-13 DIAGNOSIS — R2241 Localized swelling, mass and lump, right lower limb: Secondary | ICD-10-CM | POA: Diagnosis not present

## 2016-07-13 DIAGNOSIS — R262 Difficulty in walking, not elsewhere classified: Secondary | ICD-10-CM | POA: Insufficient documentation

## 2016-07-13 DIAGNOSIS — M25561 Pain in right knee: Secondary | ICD-10-CM | POA: Insufficient documentation

## 2016-07-13 NOTE — Therapy (Signed)
[facility] [address] [place], [place], [address] Phone: [phone]   Fax:  [phone]  Physical Therapy Evaluation  Patient Details  Name: Dylan Peterson MRN: [id number] Date of Birth: 01-28-1996 Referring Provider: York Grice  Encounter Date: 07/13/2016      PT End of Session - 07/13/16 0958    Visit Number 1   Date for PT Re-Evaluation 09/12/16   PT Start Time 0922   PT Stop Time 1020   PT Time Calculation (min) 58 min   Activity Tolerance Patient tolerated treatment well;Patient limited by pain   Behavior During Therapy [facility] for tasks assessed/performed      Past Medical History:  Diagnosis Date  . GERD (gastroesophageal reflux disease)    no current med.  . Right ACL tear 06/2016    Past Surgical History:  Procedure Laterality Date  . ARM HARDWARE REMOVAL Right 01/18/2006   humerus  . CLOSED REDUCTION AND PERCUTANEOUS PINNING OF HUMERUS FRACTURE Right 12/20/2005  . INGUINAL HERNIA REPAIR Right 08/03/2001  . KNEE ARTHROSCOPY WITH ANTERIOR CRUCIATE LIGAMENT (ACL) REPAIR Right 07/08/2016   Procedure: RIGHT KNEE ARTHROSCOPY WITH ANTERIOR CRUCIATE LIGAMENT (ACL) REPAIR with patellar tendon autograft;  Surgeon: Frederico Hamman, MD;  Location: [facility];  Service: Orthopedics;  Laterality: Right;    There were no vitals filed for this visit.       Subjective Assessment - 07/13/16 0923    Subjective Patient reports that injured his right knee playing basketball on the first of May, he underwent a right autgraft ACL repair on 07/08/16.  He reports a lot of pain and is using the CPM everyday.     Patient Stated Goals run and have no pain   Currently in Pain? Yes   Pain Score 7    Pain Location Knee   Pain Orientation Right   Pain Descriptors / Indicators Aching;Tightness;Throbbing   Pain Type Surgical pain   Pain Onset In the past 7 days   Pain Frequency Constant   Aggravating  Factors  using the CPM, trying to walk on it, trying to bend it, pain up to 9/10   Pain Relieving Factors meds help, ice helps  at best pain a 3/10   Effect of Pain on Daily Activities difficulty with ADL's, hard to walk and bend            [facility] PT Assessment - 07/13/16 0001      Assessment   Medical Diagnosis s/p right ACL repair   Referring Provider Cafferey   Onset Date/Surgical Date 07/08/16   Prior Therapy none     Precautions   Precaution Comments follow ACL protocol     Restrictions   Other Position/Activity Restrictions WBAT     Balance Screen   Has the patient fallen in the past 6 months No   Has the patient had a decrease in activity level because of a fear of falling?  No   Is the patient reluctant to leave their home because of a fear of falling?  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#, climb ladders, squatting   Leisure played basketball, golf, was playing basketball for [facility] [facility] [employer]     Observation/Other Assessments-Edema    Edema Circumferential  Circumferential Edema   Circumferential - Right 41cm mid patella   Circumferential - Left  36 cm     ROM / Strength   AROM / PROM / Strength AROM;PROM;Strength     AROM   AROM Assessment Site Knee   Right/Left Knee Right   Right Knee Extension 50  has no active quad, very painful   Right Knee Flexion 50     PROM   PROM Assessment Site Knee   Right/Left Knee Right   Right Knee Extension 0   Right Knee Flexion 50     Strength   Overall Strength Comments not assessed     Palpation   Palpation comment significant swelling of the right knee, ballotable patella     Ambulation/Gait   Gait Comments two crutches and immobilizer, typically is non weight bearing for speed, had him try single crutch with weight bearing as  tolerated, needed cue to do correctly                   [facility] Treatment/Exercise - 07/13/16 0001      Modalities   Modalities Vasopneumatic     Vasopneumatic   Number Minutes Vasopneumatic  15 minutes   Vasopnuematic Location  Knee   Vasopneumatic Pressure Medium   Vasopneumatic Temperature  35                PT Education - 07/13/16 0957    Education provided Yes   Education Details QS, SAQ, heel slides   Person(s) Educated Patient;Parent(s)   Methods Explanation;Demonstration;Handout   Comprehension Verbalized understanding;Returned demonstration;Verbal cues required;Tactile cues required          PT Short Term Goals - 07/13/16 1001      PT SHORT TERM GOAL #1   Title independent iwth initial HEP   Time 2   Period Weeks   Status New           PT Long Term Goals - 07/13/16 1001      PT LONG TERM GOAL #1   Title increase AROM of the right knee to 0-120 degrees flexion   Time 12   Period Weeks   Status New     PT LONG TERM GOAL #2   Title demonstrate stregnth of the right knee a 4+/5   Time 12   Period Weeks   Status New     PT LONG TERM GOAL #3   Title walk without assistive device all distances with minimal deviation   Time 12   Period Weeks   Status New     PT LONG TERM GOAL #4   Title decrease pain 50%   Time 12   Period Weeks   Status New               Plan - 07/13/16 0958    Clinical Impression Statement Patient hurt his right knee while playing basketball on May 1, he underwent a right ACL repair on 07/08/16.  He reports that he has had a lot of pain since the surgery, he has an immbolizer on and for the most part is non weightbearing.  His ROM actively was 0, his leg was at 50degrees flexion and he could not move it into flexion or extension, PROM was 0-50 degrees with pain for flexion.  swelling is 5cm bigger on the right   Rehab Potential Good   PT Frequency 2x / week   PT Duration 8 weeks   PT  Treatment/Interventions ADLs/Self  Care Home Management;Cryotherapy;Electrical Stimulation;Gait training;Stair training;Functional mobility training;Patient/family education;Balance training;Neuromuscular re-education;Therapeutic exercise;Therapeutic activities;Visual/perceptual remediation/compensation;Manual techniques   PT Next Visit Plan follow ACL protocol   Consulted and Agree with Plan of Care Patient      Patient will benefit from skilled therapeutic intervention in order to improve the following deficits and impairments:  Abnormal gait, Decreased activity tolerance, Decreased balance, Decreased endurance, Decreased mobility, Decreased strength, Increased edema, Pain, Decreased range of motion, Difficulty walking  Visit Diagnosis: Acute pain of right knee - Plan: PT plan of care cert/re-cert  Stiffness of right knee, not elsewhere classified - Plan: PT plan of care cert/re-cert  Difficulty in walking, not elsewhere classified - Plan: PT plan of care cert/re-cert  Localized swelling, mass and lump, right lower limb - Plan: PT plan of care cert/re-cert     Problem List Patient Active Problem List   Diagnosis Date Noted  . Chest wall pain 11/09/2014  . SOB (shortness of breath) 11/09/2014  . Left ankle pain 01/17/2014  . Well adolescent visit 10/17/2013  . Low back pain 10/17/2013  . 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 ADHD 01/23/2008    Jearld Lesch., PT 07/13/2016, 10:04 AM  [facility] [address] [place], [place], [address] Phone: [phone]   Fax:  [phone]  Name: Dylan Peterson MRN: [id number] Date of Birth: 1995-11-06

## 2016-07-16 DIAGNOSIS — S83512D Sprain of anterior cruciate ligament of left knee, subsequent encounter: Secondary | ICD-10-CM | POA: Diagnosis not present

## 2016-07-17 ENCOUNTER — Encounter: Payer: Self-pay | Admitting: Physical Therapy

## 2016-07-17 ENCOUNTER — Ambulatory Visit: Payer: 59 | Admitting: Physical Therapy

## 2016-07-17 DIAGNOSIS — R2241 Localized swelling, mass and lump, right lower limb: Secondary | ICD-10-CM

## 2016-07-17 DIAGNOSIS — R262 Difficulty in walking, not elsewhere classified: Secondary | ICD-10-CM

## 2016-07-17 DIAGNOSIS — M25561 Pain in right knee: Secondary | ICD-10-CM | POA: Diagnosis not present

## 2016-07-17 DIAGNOSIS — M25661 Stiffness of right knee, not elsewhere classified: Secondary | ICD-10-CM

## 2016-07-17 NOTE — Therapy (Signed)
[facility] [address] [place], [place], [address] Phone: [phone]   Fax:  [phone]  Physical Therapy Treatment  Patient Details  Name: Dylan Peterson MRN: [id number] Date of Birth: [birth year] Referring Provider: York Grice  Encounter Date: 07/17/2016      PT End of Session - 07/17/16 0913    Visit Number 2   Date for PT Re-Evaluation 09/12/16   PT Start Time 0840   PT Stop Time 0940   PT Time Calculation (min) 60 min      Past Medical History:  Diagnosis Date  . GERD (gastroesophageal reflux disease)    no current med.  . Right ACL tear 06/2016    Past Surgical History:  Procedure Laterality Date  . ARM HARDWARE REMOVAL Right 01/18/2006   humerus  . CLOSED REDUCTION AND PERCUTANEOUS PINNING OF HUMERUS FRACTURE Right 12/20/2005  . INGUINAL HERNIA REPAIR Right 08/03/2001  . KNEE ARTHROSCOPY WITH ANTERIOR CRUCIATE LIGAMENT (ACL) REPAIR Right 07/08/2016   Procedure: RIGHT KNEE ARTHROSCOPY WITH ANTERIOR CRUCIATE LIGAMENT (ACL) REPAIR with patellar tendon autograft;  Surgeon: Frederico Hamman, MD;  Location: [facility];  Service: Orthopedics;  Laterality: Right;    There were no vitals filed for this visit.      Subjective Assessment - 07/17/16 0908    Subjective rough night with pain, barely slept   Currently in Pain? Yes   Pain Score 7    Pain Location Knee   Pain Orientation Right                         [facility] Adult PT Treatment/Exercise - 07/17/16 0001      Exercises   Exercises Knee/Hip     Knee/Hip Exercises: Supine   Quad Sets Strengthening;Right;5 sets;5 reps  AA and ecc control   Short Arc Quad Sets Right;3 sets;5 reps  with assistance   Heel Slides Right;4 sets;5 reps   Terminal Knee Extension Right;5 sets;5 reps  yellow tband then with ball     Modalities   Modalities Electrical Stimulation     Electrical Stimulation   Electrical  Stimulation Location RT knee   Electrical Stimulation Action IFC   Electrical Stimulation Parameters Plus VMS for quad stim 10 on/ 10 off 10 min   Electrical Stimulation Goals Pain;Strength;Edema     Vasopneumatic   Number Minutes Vasopneumatic  15 minutes   Vasopnuematic Location  Knee   Vasopneumatic Pressure Medium   Vasopneumatic Temperature  33     Manual Therapy   Manual Therapy Soft tissue mobilization;Passive ROM                  PT Short Term Goals - 07/13/16 1001      PT SHORT TERM GOAL #1   Title independent iwth initial HEP   Time 2   Period Weeks   Status New           PT Long Term Goals - 07/13/16 1001      PT LONG TERM GOAL #1   Title increase AROM of the right knee to 0-120 degrees flexion   Time 12   Period Weeks   Status New     PT LONG TERM GOAL #2   Title demonstrate stregnth of the right knee a 4+/5   Time 12   Period Weeks   Status New     PT LONG TERM GOAL #3   Title walk without  assistive device all distances with minimal deviation   Time 12   Period Weeks   Status New     PT LONG TERM GOAL #4   Title decrease pain 50%   Time 12   Period Weeks   Status New               Plan - 07/17/16 0913    Clinical Impression Statement weak quad and needed assistance with verb and tactile cuing   PT Next Visit Plan follow ACL protocol. stim quads      Patient will benefit from skilled therapeutic intervention in order to improve the following deficits and impairments:  Abnormal gait, Decreased activity tolerance, Decreased balance, Decreased endurance, Decreased mobility, Decreased strength, Increased edema, Pain, Decreased range of motion, Difficulty walking  Visit Diagnosis: Acute pain of right knee  Stiffness of right knee, not elsewhere classified  Difficulty in walking, not elsewhere classified  Localized swelling, mass and lump, right lower limb     Problem List Patient Active Problem List   Diagnosis  Date Noted  . Chest wall pain 11/09/2014  . SOB (shortness of breath) 11/09/2014  . Left ankle pain 01/17/2014  . Well adolescent visit 10/17/2013  . Low back pain 10/17/2013  . 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 ADHD 01/23/2008    PAYSEUR,ANGIE  PTA 07/17/2016, 9:18 AM  [facility] [address] [place], [place], [address] Phone: [phone]   Fax:  [phone]  Name: Dylan Peterson MRN: [id number] Date of Birth: 1995-05-13

## 2016-07-21 ENCOUNTER — Ambulatory Visit: Payer: 59 | Admitting: Physical Therapy

## 2016-07-21 ENCOUNTER — Encounter: Payer: Self-pay | Admitting: Physical Therapy

## 2016-07-21 DIAGNOSIS — M25661 Stiffness of right knee, not elsewhere classified: Secondary | ICD-10-CM | POA: Diagnosis not present

## 2016-07-21 DIAGNOSIS — R2241 Localized swelling, mass and lump, right lower limb: Secondary | ICD-10-CM

## 2016-07-21 DIAGNOSIS — R262 Difficulty in walking, not elsewhere classified: Secondary | ICD-10-CM

## 2016-07-21 DIAGNOSIS — M25561 Pain in right knee: Secondary | ICD-10-CM | POA: Diagnosis not present

## 2016-07-21 NOTE — Therapy (Signed)
[facility] [place] [facility] [address] [place], [place], [address] Phone: [phone]   Fax:  [phone]  Physical Therapy Treatment  Patient Details  Name: Dylan Peterson MRN: [id number] Date of Birth: 24-Mar-1995 Referring Provider: York Grice  Encounter Date: 07/21/2016      PT End of Session - 07/21/16 1003    Visit Number 3   Date for PT Re-Evaluation 09/12/16   PT Start Time 0920   PT Stop Time 1015   PT Time Calculation (min) 55 min   Activity Tolerance Patient tolerated treatment well;Patient limited by pain   Behavior During Therapy [facility] for tasks assessed/performed      Past Medical History:  Diagnosis Date  . GERD (gastroesophageal reflux disease)    no current med.  . Right ACL tear 06/2016    Past Surgical History:  Procedure Laterality Date  . ARM HARDWARE REMOVAL Right 01/18/2006   humerus  . CLOSED REDUCTION AND PERCUTANEOUS PINNING OF HUMERUS FRACTURE Right 12/20/2005  . INGUINAL HERNIA REPAIR Right 08/03/2001  . KNEE ARTHROSCOPY WITH ANTERIOR CRUCIATE LIGAMENT (ACL) REPAIR Right 07/08/2016   Procedure: RIGHT KNEE ARTHROSCOPY WITH ANTERIOR CRUCIATE LIGAMENT (ACL) REPAIR with patellar tendon autograft;  Surgeon: Frederico Hamman, MD;  Location: [facility];  Service: Orthopedics;  Laterality: Right;    There were no vitals filed for this visit.      Subjective Assessment - 07/21/16 0929    Subjective C/O soreness in the knee, usually worse in the AM   Currently in Pain? Yes   Pain Score 7    Pain Location Knee   Pain Orientation Right                         [facility] Adult PT Treatment/Exercise - 07/21/16 0001      Ambulation/Gait   Gait Comments gait without crutches with immobilizer working on full weightbearing     Knee/Hip Exercises: Stretches   Gastroc Stretch 3 reps;20 seconds     Knee/Hip Exercises: Aerobic   Nustep Nustep 30 degree flexion limit x 3  minutes     Knee/Hip Exercises: Machines for Strengthening   Cybex Leg Press in a range of motion 10-30 degrees using both legs     Knee/Hip Exercises: Standing   Heel Raises 2 sets;15 reps   Heel Raises Limitations also did toe raises 2x15   Hip Flexion 2 sets;15 reps   Terminal Knee Extension Limitations use of blue tband standing   Hip Abduction 2 sets;15 reps   Hip Extension 2 sets;15 reps     Knee/Hip Exercises: Supine   Quad Sets Strengthening;Right;5 sets;5 reps   Short Arc Quad Sets Right;3 sets;5 reps   Short Arc Quad Sets Limitations needed assist of yellow tband   Patellar Mobs by PT     Vasopneumatic   Number Minutes Vasopneumatic  15 minutes   Vasopnuematic Location  Knee   Vasopneumatic Pressure Medium   Vasopneumatic Temperature  33                  PT Short Term Goals - 07/13/16 1001      PT SHORT TERM GOAL #1   Title independent iwth initial HEP   Time 2   Period Weeks   Status New           PT Long Term Goals - 07/13/16 1001      PT LONG TERM GOAL #1  Title increase AROM of the right knee to 0-120 degrees flexion   Time 12   Period Weeks   Status New     PT LONG TERM GOAL #2   Title demonstrate stregnth of the right knee a 4+/5   Time 12   Period Weeks   Status New     PT LONG TERM GOAL #3   Title walk without assistive device all distances with minimal deviation   Time 12   Period Weeks   Status New     PT LONG TERM GOAL #4   Title decrease pain 50%   Time 12   Period Weeks   Status New               Plan - 07/21/16 1003    Clinical Impression Statement Started to progress the protocol, still in week 2, had him do some weightbearing, TKE in standing, 4 way hip in brace etc...  He is still very weak with quads but I am seeing some contractions   PT Next Visit Plan follow ACL protocol. stim quads   Consulted and Agree with Plan of Care Patient      Patient will benefit from skilled therapeutic intervention  in order to improve the following deficits and impairments:  Abnormal gait, Decreased activity tolerance, Decreased balance, Decreased endurance, Decreased mobility, Decreased strength, Increased edema, Pain, Decreased range of motion, Difficulty walking  Visit Diagnosis: Acute pain of right knee  Stiffness of right knee, not elsewhere classified  Difficulty in walking, not elsewhere classified  Localized swelling, mass and lump, right lower limb     Problem List Patient Active Problem List   Diagnosis Date Noted  . Chest wall pain 11/09/2014  . SOB (shortness of breath) 11/09/2014  . Left ankle pain 01/17/2014  . Well adolescent visit 10/17/2013  . Low back pain 10/17/2013  . 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 ADHD 01/23/2008    Dylan Peterson,Dylan Bayard W., PT 07/21/2016, 10:05 AM  [facility] [address] [place], [place], [address] Phone: [phone]   Fax:  [phone]  Name: Dylan Peterson MRN: [id number] Date of Birth: [birth year]

## 2016-07-23 ENCOUNTER — Ambulatory Visit: Payer: 59 | Admitting: Physical Therapy

## 2016-07-23 ENCOUNTER — Encounter: Payer: Self-pay | Admitting: Physical Therapy

## 2016-07-23 DIAGNOSIS — R262 Difficulty in walking, not elsewhere classified: Secondary | ICD-10-CM | POA: Diagnosis not present

## 2016-07-23 DIAGNOSIS — M25561 Pain in right knee: Secondary | ICD-10-CM | POA: Diagnosis not present

## 2016-07-23 DIAGNOSIS — M25661 Stiffness of right knee, not elsewhere classified: Secondary | ICD-10-CM

## 2016-07-23 DIAGNOSIS — R2241 Localized swelling, mass and lump, right lower limb: Secondary | ICD-10-CM

## 2016-07-23 NOTE — Therapy (Signed)
[place], [place], [address] Phone: [phone]   Fax:  [phone]  Physical Therapy Treatment  Patient Details  Name: Dylan Peterson MRN: [id number] Date of Birth: September 21, 1995 Referring Provider: Shireen Quan  Encounter Date: 07/23/2016      PT End of Session - 07/23/16 1656    Visit Number 4   Date for PT Re-Evaluation 09/12/16   PT Start Time 4497   PT Stop Time 1710   PT Time Calculation (min) 53 min   Activity Tolerance Patient tolerated treatment well;Patient limited by pain   Behavior During Therapy [facility] for tasks assessed/performed      Past Medical History:  Diagnosis Date  . GERD (gastroesophageal reflux disease)    no current med.  . Right ACL tear 06/2016    Past Surgical History:  Procedure Laterality Date  . ARM HARDWARE REMOVAL Right 01/18/2006   humerus  . CLOSED REDUCTION AND PERCUTANEOUS PINNING OF HUMERUS FRACTURE Right 12/20/2005  . INGUINAL HERNIA REPAIR Right 08/03/2001  . KNEE ARTHROSCOPY WITH ANTERIOR CRUCIATE LIGAMENT (ACL) REPAIR Right 07/08/2016   Procedure: RIGHT KNEE ARTHROSCOPY WITH ANTERIOR CRUCIATE LIGAMENT (ACL) REPAIR with patellar tendon autograft;  Surgeon: Earlie Server, MD;  Location: [facility];  Service: Orthopedics;  Laterality: Right;    There were no vitals filed for this visit.      Subjective Assessment - 07/23/16 1641    Subjective Reports that he was really sore the next day after the last visit   Currently in Pain? Yes   Pain Score 7    Pain Location Knee   Pain Orientation Right   Pain Descriptors / Indicators Aching;Sore   Pain Type Surgical pain   Aggravating Factors  bending, using the leg   Pain Relieving Factors ice                         [facility] Adult PT Treatment/Exercise - 07/23/16 0001      Ambulation/Gait   Gait Comments gait without crutches with immobilizer working on full  weightbearing     Knee/Hip Exercises: Stretches   Gastroc Stretch 3 reps;20 seconds     Knee/Hip Exercises: Aerobic   Nustep Nustep 30 degree flexion limit x 3 minutes     Knee/Hip Exercises: Standing   Heel Raises 2 sets;15 reps   Heel Raises Limitations also did toe raises 2x15   Hip Flexion 2 sets;15 reps   Terminal Knee Extension Limitations use of blue tband standing   Hip Abduction 2 sets;15 reps   Hip Extension 2 sets;15 reps   Other Standing Knee Exercises 4" toe clears     Knee/Hip Exercises: Supine   Quad Sets Strengthening;Right;5 sets;5 reps   Short Arc Quad Sets Right;3 sets;5 reps   Short Arc Quad Sets Limitations assisted with tband   Straight Leg Raises Right;4 sets;5 reps   Straight Leg Raises Limitations tband assist     Acupuncturist Stimulation Location RT knee   Electrical Stimulation Action IFC   Electrical Stimulation Parameters elevated   Electrical Stimulation Goals Pain;Edema     Vasopneumatic   Number Minutes Vasopneumatic  15 minutes   Vasopnuematic Location  Knee   Vasopneumatic Pressure Medium   Vasopneumatic Temperature  33                  PT Short Term Goals - 07/23/16 1657  PT SHORT TERM GOAL #1   Title independent iwth initial HEP   Status Partially Met           PT Long Term Goals - 07/13/16 1001      PT LONG TERM GOAL #1   Title increase AROM of the right knee to 0-120 degrees flexion   Time 12   Period Weeks   Status New     PT LONG TERM GOAL #2   Title demonstrate stregnth of the right knee a 4+/5   Time 12   Period Weeks   Status New     PT LONG TERM GOAL #3   Title walk without assistive device all distances with minimal deviation   Time 12   Period Weeks   Status New     PT LONG TERM GOAL #4   Title decrease pain 50%   Time 12   Period Weeks   Status New               Plan - 07/23/16 1656    Clinical Impression Statement Patient was very sore after the  last treatment, decreased the activity, focused on the quads activating and treated for pain   PT Next Visit Plan follow ACL protocol. stim quads   Consulted and Agree with Plan of Care Patient      Patient will benefit from skilled therapeutic intervention in order to improve the following deficits and impairments:  Abnormal gait, Decreased activity tolerance, Decreased balance, Decreased endurance, Decreased mobility, Decreased strength, Increased edema, Pain, Decreased range of motion, Difficulty walking  Visit Diagnosis: Acute pain of right knee  Stiffness of right knee, not elsewhere classified  Difficulty in walking, not elsewhere classified  Localized swelling, mass and lump, right lower limb     Problem List Patient Active Problem List   Diagnosis Date Noted  . Chest wall pain 11/09/2014  . SOB (shortness of breath) 11/09/2014  . Left ankle pain 01/17/2014  . Well adolescent visit 10/17/2013  . Low back pain 10/17/2013  . 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 ADHD 01/23/2008    Sumner Boast., PT 07/23/2016, 4:58 PM  [place] [address] [place], [place], [address] Phone: [phone]   Fax:  [phone]  Name: YEHONATAN GRANDISON MRN: [id number] Date of Birth: 1995-06-03

## 2016-07-28 ENCOUNTER — Ambulatory Visit: Payer: 59 | Admitting: Physical Therapy

## 2016-07-28 ENCOUNTER — Encounter: Payer: Self-pay | Admitting: Physical Therapy

## 2016-07-28 DIAGNOSIS — M25561 Pain in right knee: Secondary | ICD-10-CM

## 2016-07-28 DIAGNOSIS — M25661 Stiffness of right knee, not elsewhere classified: Secondary | ICD-10-CM | POA: Diagnosis not present

## 2016-07-28 DIAGNOSIS — R2241 Localized swelling, mass and lump, right lower limb: Secondary | ICD-10-CM

## 2016-07-28 DIAGNOSIS — R262 Difficulty in walking, not elsewhere classified: Secondary | ICD-10-CM | POA: Diagnosis not present

## 2016-07-28 NOTE — Therapy (Signed)
[facility] [place] [address] [place], [place], [address] Phone: [phone]   Fax:  [phone]  Physical Therapy Treatment  Patient Details  Name: Dylan Peterson MRN: [id number] Date of Birth: 01-30-1996 Referring Provider: York Grice  Encounter Date: 07/28/2016      PT End of Session - 07/28/16 1735    Visit Number 5   Date for PT Re-Evaluation 09/12/16   PT Start Time 1657   PT Stop Time 1755   PT Time Calculation (min) 58 min   Activity Tolerance Patient tolerated treatment well;Patient limited by pain   Behavior During Therapy [facility] for tasks assessed/performed      Past Medical History:  Diagnosis Date  . GERD (gastroesophageal reflux disease)    no current med.  . Right ACL tear 06/2016    Past Surgical History:  Procedure Laterality Date  . ARM HARDWARE REMOVAL Right 01/18/2006   humerus  . CLOSED REDUCTION AND PERCUTANEOUS PINNING OF HUMERUS FRACTURE Right 12/20/2005  . INGUINAL HERNIA REPAIR Right 08/03/2001  . KNEE ARTHROSCOPY WITH ANTERIOR CRUCIATE LIGAMENT (ACL) REPAIR Right 07/08/2016   Procedure: RIGHT KNEE ARTHROSCOPY WITH ANTERIOR CRUCIATE LIGAMENT (ACL) REPAIR with patellar tendon autograft;  Surgeon: Frederico Hamman, MD;  Location: [facility];  Service: Orthopedics;  Laterality: Right;    There were no vitals filed for this visit.      Subjective Assessment - 07/28/16 1700    Subjective Continues to report high rating of pain, very sore with activities, reports that over the weekend he did try to walk more.   Currently in Pain? Yes   Pain Score 5    Pain Location Knee   Pain Orientation Right   Pain Descriptors / Indicators Sore   Pain Type Surgical pain   Aggravating Factors  bending and activity pain up to 7/10            [facility] PT Assessment - 07/28/16 0001      AROM   Right Knee Extension 30   Right Knee Flexion 65     PROM   Right Knee Extension 0   Right Knee Flexion 90                     [facility] Adult PT Treatment/Exercise - 07/28/16 0001      Knee/Hip Exercises: Aerobic   Nustep Nustep 60 degree flexion limit x 5 minutes     Knee/Hip Exercises: Standing   Heel Raises 2 sets;15 reps   Hip Flexion 2 sets;15 reps   Terminal Knee Extension Limitations with ball behind the knee   Hip Abduction 2 sets;15 reps   Hip Extension 2 sets;15 reps   Other Standing Knee Exercises 4" toe clears   Other Standing Knee Exercises did hip flexion , abduction with the left leg with him bearing full weight on the left with PT gaurding the knee     Knee/Hip Exercises: Supine   Quad Sets Strengthening;Right;5 sets;5 reps   Short Arc Quad Sets Right;3 sets;5 reps   Short Arc Quad Sets Limitations PT assist with tband   Straight Leg Raises Right;4 sets;5 reps   Straight Leg Raises Limitations tband assist     Vasopneumatic   Number Minutes Vasopneumatic  15 minutes   Vasopnuematic Location  Knee   Vasopneumatic Pressure Medium   Vasopneumatic Temperature  33     Manual Therapy   Manual Therapy Passive ROM   Passive ROM at edge  of bed, working on flexion                  PT Short Term Goals - 07/28/16 1737      PT SHORT TERM GOAL #1   Title independent with initial HEP   Status Achieved           PT Long Term Goals - 07/28/16 1737      PT LONG TERM GOAL #1   Title increase AROM of the right knee to 0-120 degrees flexion   Status On-going     PT LONG TERM GOAL #2   Title demonstrate stregnth of the right knee a 4+/5   Status On-going     PT LONG TERM GOAL #3   Title walk without assistive device all distances with minimal deviation   Status On-going     PT LONG TERM GOAL #4   Title decrease pain 50%   Status On-going               Plan - 07/28/16 1735    Clinical Impression Statement Patient has been making progress with ROM and his quads, he however started out without any active quads and  minimal ROM, he continues to c/o  alot of pain with knee flexion.  He has been very afraid of bending the knee as well as bearing weight.  We have used estim to activate the quads.   Clinical Presentation Evolving   Clinical Decision Making Moderate   PT Next Visit Plan please advise of any ROM limitations   Consulted and Agree with Plan of Care Patient      Patient will benefit from skilled therapeutic intervention in order to improve the following deficits and impairments:  Abnormal gait, Decreased activity tolerance, Decreased balance, Decreased endurance, Decreased mobility, Decreased strength, Increased edema, Pain, Decreased range of motion, Difficulty walking  Visit Diagnosis: Acute pain of right knee  Stiffness of right knee, not elsewhere classified  Difficulty in walking, not elsewhere classified  Localized swelling, mass and lump, right lower limb     Problem List Patient Active Problem List   Diagnosis Date Noted  . Chest wall pain 11/09/2014  . SOB (shortness of breath) 11/09/2014  . Left ankle pain 01/17/2014  . Well adolescent visit 10/17/2013  . Low back pain 10/17/2013  . 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 ADHD 01/23/2008    [name],Shelden Raborn W., PT 07/28/2016, 5:38 PM  [facility] [address] [place], [place], [address] Phone: [phone]   Fax:  [phone]  Name: Vernia BuffChandler T Jessop MRN: [id number] Date of Birth: 07-Jun-1995

## 2016-07-30 ENCOUNTER — Ambulatory Visit: Payer: 59 | Admitting: Physical Therapy

## 2016-07-30 ENCOUNTER — Encounter: Payer: Self-pay | Admitting: Physical Therapy

## 2016-07-30 DIAGNOSIS — S83512D Sprain of anterior cruciate ligament of left knee, subsequent encounter: Secondary | ICD-10-CM | POA: Diagnosis not present

## 2016-07-30 DIAGNOSIS — R2241 Localized swelling, mass and lump, right lower limb: Secondary | ICD-10-CM

## 2016-07-30 DIAGNOSIS — M25561 Pain in right knee: Secondary | ICD-10-CM | POA: Diagnosis not present

## 2016-07-30 DIAGNOSIS — M25661 Stiffness of right knee, not elsewhere classified: Secondary | ICD-10-CM | POA: Diagnosis not present

## 2016-07-30 DIAGNOSIS — R262 Difficulty in walking, not elsewhere classified: Secondary | ICD-10-CM

## 2016-07-30 NOTE — Therapy (Signed)
[facility] [place] [facility] [address] [place], [place], [address] Phone: [phone]   Fax:  [phone]  Physical Therapy Treatment  Patient Details  Name: Dylan Peterson MRN: [id number] Date of Birth: 1995/09/17 Referring Provider: York Grice  Encounter Date: 07/30/2016      PT End of Session - 07/30/16 1141    Visit Number 6   Date for PT Re-Evaluation 09/12/16   PT Start Time 1100   PT Stop Time 1157   PT Time Calculation (min) 57 min   Activity Tolerance Patient tolerated treatment well;Patient limited by pain   Behavior During Therapy [facility] for tasks assessed/performed      Past Medical History:  Diagnosis Date  . GERD (gastroesophageal reflux disease)    no current med.  . Right ACL tear 06/2016    Past Surgical History:  Procedure Laterality Date  . ARM HARDWARE REMOVAL Right 01/18/2006   humerus  . CLOSED REDUCTION AND PERCUTANEOUS PINNING OF HUMERUS FRACTURE Right 12/20/2005  . INGUINAL HERNIA REPAIR Right 08/03/2001  . KNEE ARTHROSCOPY WITH ANTERIOR CRUCIATE LIGAMENT (ACL) REPAIR Right 07/08/2016   Procedure: RIGHT KNEE ARTHROSCOPY WITH ANTERIOR CRUCIATE LIGAMENT (ACL) REPAIR with patellar tendon autograft;  Surgeon: Frederico Hamman, MD;  Location: [facility];  Service: Orthopedics;  Laterality: Right;    There were no vitals filed for this visit.      Subjective Assessment - 07/30/16 1100    Subjective Pt reports pain after last treatment.   Currently in Pain? Yes   Pain Score 5    Pain Location Knee                         [facility] Adult PT Treatment/Exercise - 07/30/16 0001      Knee/Hip Exercises: Aerobic   Nustep Nustep 60 degree flexion limit x 5 minutes     Knee/Hip Exercises: Supine   Quad Sets Strengthening;Right;5 sets;2 sets;15 reps  green Tband    Short Arc Quad Sets Right;2 sets;10 reps   Short Arc Quad Sets Limitations assist needed as fatigue    Heel  Slides Right;2 sets;10 reps   Straight Leg Raises 2 sets;10 reps;Right   Other Supine Knee/Hip Exercises hip abd/add with sliding board 2x10     Vasopneumatic   Number Minutes Vasopneumatic  15 minutes   Vasopnuematic Location  Knee   Vasopneumatic Pressure Medium   Vasopneumatic Temperature  33     Manual Therapy   Manual Therapy Passive ROM   Passive ROM supine, with heel slides                   PT Short Term Goals - 07/28/16 1737      PT SHORT TERM GOAL #1   Title independent with initial HEP   Status Achieved           PT Long Term Goals - 07/28/16 1737      PT LONG TERM GOAL #1   Title increase AROM of the right knee to 0-120 degrees flexion   Status On-going     PT LONG TERM GOAL #2   Title demonstrate stregnth of the right knee a 4+/5   Status On-going     PT LONG TERM GOAL #3   Title walk without assistive device all distances with minimal deviation   Status On-going     PT LONG TERM GOAL #4   Title decrease pain 50%   Status  On-going               Plan - 07/30/16 1142    Clinical Impression Statement Pt able to perform quad set, SQA, SLR, and heel slides without assist. With LAQ pt quad activation improved with later sets. C/O pain with MT. Pt is afraid of bending the knee and weight bearing.   Rehab Potential Good   PT Frequency 2x / week   PT Duration 8 weeks   PT Treatment/Interventions ADLs/Self Care Home Management;Cryotherapy;Electrical Stimulation;Gait training;Stair training;Functional mobility training;Patient/family education;Balance training;Neuromuscular re-education;Therapeutic exercise;Therapeutic activities;Visual/perceptual remediation/compensation;Manual techniques   PT Next Visit Plan please advise of any ROM limitations      Patient will benefit from skilled therapeutic intervention in order to improve the following deficits and impairments:  Abnormal gait, Decreased activity tolerance, Decreased balance, Decreased  endurance, Decreased mobility, Decreased strength, Increased edema, Pain, Decreased range of motion, Difficulty walking  Visit Diagnosis: Acute pain of right knee  Stiffness of right knee, not elsewhere classified  Difficulty in walking, not elsewhere classified  Localized swelling, mass and lump, right lower limb     Problem List Patient Active Problem List   Diagnosis Date Noted  . Chest wall pain 11/09/2014  . SOB (shortness of breath) 11/09/2014  . Left ankle pain 01/17/2014  . Well adolescent visit 10/17/2013  . Low back pain 10/17/2013  . 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 ADHD 01/23/2008    Grayce Sessionsonald G Riyan Haile, PTA 07/30/2016, 11:46 AM  [facility] [address] [place], [place], [address] Phone: [phone]   Fax:  [phone]  Name: Dylan Peterson MRN: [id number] Date of Birth: Oct 15, 1995

## 2016-08-04 ENCOUNTER — Ambulatory Visit: Payer: 59 | Attending: Orthopedic Surgery | Admitting: Physical Therapy

## 2016-08-04 ENCOUNTER — Encounter: Payer: Self-pay | Admitting: Physical Therapy

## 2016-08-04 DIAGNOSIS — R2241 Localized swelling, mass and lump, right lower limb: Secondary | ICD-10-CM | POA: Diagnosis not present

## 2016-08-04 DIAGNOSIS — M25561 Pain in right knee: Secondary | ICD-10-CM | POA: Diagnosis not present

## 2016-08-04 DIAGNOSIS — M25661 Stiffness of right knee, not elsewhere classified: Secondary | ICD-10-CM | POA: Insufficient documentation

## 2016-08-04 DIAGNOSIS — R262 Difficulty in walking, not elsewhere classified: Secondary | ICD-10-CM | POA: Insufficient documentation

## 2016-08-04 NOTE — Therapy (Signed)
[facility] [place] [facility] [address] [place], [place], [address] Phone: [phone]   Fax:  [phone]  Physical Therapy Treatment  Patient Details  Name: Dylan Peterson MRN: [id number] Date of Birth: 02/15/1996 Referring Provider: York Grice  Encounter Date: 08/04/2016      PT End of Session - 08/04/16 1052    Visit Number 7   PT Start Time 1015   PT Stop Time 1110   PT Time Calculation (min) 55 min      Past Medical History:  Diagnosis Date  . GERD (gastroesophageal reflux disease)    no current med.  . Right ACL tear 06/2016    Past Surgical History:  Procedure Laterality Date  . ARM HARDWARE REMOVAL Right 01/18/2006   humerus  . CLOSED REDUCTION AND PERCUTANEOUS PINNING OF HUMERUS FRACTURE Right 12/20/2005  . INGUINAL HERNIA REPAIR Right 08/03/2001  . KNEE ARTHROSCOPY WITH ANTERIOR CRUCIATE LIGAMENT (ACL) REPAIR Right 07/08/2016   Procedure: RIGHT KNEE ARTHROSCOPY WITH ANTERIOR CRUCIATE LIGAMENT (ACL) REPAIR with patellar tendon autograft;  Surgeon: Frederico Hamman, MD;  Location: [facility];  Service: Orthopedics;  Laterality: Right;    There were no vitals filed for this visit.      Subjective Assessment - 08/04/16 1012    Subjective better   Currently in Pain? Yes   Pain Score 3    Pain Location Knee            [facility] PT Assessment - 08/04/16 0001      AROM   AROM Assessment Site Knee  SEATED   Right/Left Knee Right   Right Knee Extension 7   Right Knee Flexion 105                     [facility] Adult PT Treatment/Exercise - 08/04/16 0001      Knee/Hip Exercises: Aerobic   Recumbent Bike 6 min for ROM   Nustep Nustep 60 degree flexion limit x 6 minutes     Knee/Hip Exercises: Standing   Lateral Step Up Right;1 set;15 reps;Hand Hold: 2;Step Height: 2"  airex   Other Standing Knee Exercises on airex marching 10 each  PTA to stab RT      Knee/Hip Exercises: Seated    Long Arc Quad Right;2 sets;10 reps   Hamstring Curl Strengthening;Right;2 sets;10 reps   Sit to [employer] 10 reps;without UE support  verb and tactile cuing for compensation     Knee/Hip Exercises: Supine   Short Arc Quad Sets Strengthening;Right;2 sets;10 reps  3 sec hold   Straight Leg Raises Strengthening;Right;2 sets;10 reps  cuing to decrease compensation   Knee Extension Strengthening;Right;2 sets;10 reps  2 sets 10 with red tband     Knee/Hip Exercises: Sidelying   Hip ABduction Strengthening;Right;15 reps  Fwd and back circles     Vasopneumatic   Number Minutes Vasopneumatic  15 minutes   Vasopnuematic Location  Knee   Vasopneumatic Pressure Medium                  PT Short Term Goals - 07/28/16 1737      PT SHORT TERM GOAL #1   Title independent with initial HEP   Status Achieved           PT Long Term Goals - 07/28/16 1737      PT LONG TERM GOAL #1   Title increase AROM of the right knee to 0-120 degrees flexion   Status On-going  PT LONG TERM GOAL #2   Title demonstrate stregnth of the right knee a 4+/5   Status On-going     PT LONG TERM GOAL #3   Title walk without assistive device all distances with minimal deviation   Status On-going     PT LONG TERM GOAL #4   Title decrease pain 50%   Status On-going               Plan - 08/04/16 1052    Clinical Impression Statement excellent increase in AROM and quad mvmt. Vcing and tactile cuing needed to prevent compensation but quad firing with all ex   PT Next Visit Plan cont per protocol      Patient will benefit from skilled therapeutic intervention in order to improve the following deficits and impairments:     Visit Diagnosis: Acute pain of right knee  Stiffness of right knee, not elsewhere classified  Difficulty in walking, not elsewhere classified  Localized swelling, mass and lump, right lower limb     Problem List Patient Active Problem List   Diagnosis Date  Noted  . Chest wall pain 11/09/2014  . SOB (shortness of breath) 11/09/2014  . Left ankle pain 01/17/2014  . Well adolescent visit 10/17/2013  . Low back pain 10/17/2013  . 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 ADHD 01/23/2008    PAYSEUR,ANGIE PTA 08/04/2016, 10:54 AM  [facility] [address] [place], [place], [address] Phone: [phone]   Fax:  [phone]  Name: Dylan Peterson MRN: [id number] Date of Birth: 01-21-1996

## 2016-08-06 ENCOUNTER — Ambulatory Visit: Payer: 59 | Admitting: Physical Therapy

## 2016-08-06 ENCOUNTER — Encounter: Payer: Self-pay | Admitting: Physical Therapy

## 2016-08-06 DIAGNOSIS — R262 Difficulty in walking, not elsewhere classified: Secondary | ICD-10-CM | POA: Diagnosis not present

## 2016-08-06 DIAGNOSIS — R2241 Localized swelling, mass and lump, right lower limb: Secondary | ICD-10-CM

## 2016-08-06 DIAGNOSIS — M25661 Stiffness of right knee, not elsewhere classified: Secondary | ICD-10-CM | POA: Diagnosis not present

## 2016-08-06 DIAGNOSIS — M25561 Pain in right knee: Secondary | ICD-10-CM | POA: Diagnosis not present

## 2016-08-06 NOTE — Therapy (Signed)
[place], [place], [address] Phone: [phone]   Fax:  [phone]  Physical Therapy Treatment  Patient Details  Name: Dylan Peterson MRN: [id number] Date of Birth: 01/31/1996 Referring Provider: Shireen Quan  Encounter Date: 08/06/2016      PT End of Session - 08/06/16 1737    Visit Number 8   Date for PT Re-Evaluation 09/12/16   PT Start Time 1700   PT Stop Time 1800   PT Time Calculation (min) 60 min      Past Medical History:  Diagnosis Date  . GERD (gastroesophageal reflux disease)    no current med.  . Right ACL tear 06/2016    Past Surgical History:  Procedure Laterality Date  . ARM HARDWARE REMOVAL Right 01/18/2006   humerus  . CLOSED REDUCTION AND PERCUTANEOUS PINNING OF HUMERUS FRACTURE Right 12/20/2005  . INGUINAL HERNIA REPAIR Right 08/03/2001  . KNEE ARTHROSCOPY WITH ANTERIOR CRUCIATE LIGAMENT (ACL) REPAIR Right 07/08/2016   Procedure: RIGHT KNEE ARTHROSCOPY WITH ANTERIOR CRUCIATE LIGAMENT (ACL) REPAIR with patellar tendon autograft;  Surgeon: Earlie Server, MD;  Location: [facility];  Service: Orthopedics;  Laterality: Right;    There were no vitals filed for this visit.      Subjective Assessment - 08/06/16 1703    Subjective getting stonger, doing better   Currently in Pain? Yes   Pain Score 3    Pain Location Knee   Pain Orientation Right                         [facility] Adult PT Treatment/Exercise - 08/06/16 0001      Knee/Hip Exercises: Aerobic   Recumbent Bike 6 min for ROM   Nustep Nustep 60 degree flexion limit x 6 minutes     Knee/Hip Exercises: Standing   Lateral Step Up Right;1 set;15 reps;Hand Hold: 2;Step Height: 2"  airex   Forward Step Up 1 set;15 reps;Right;Hand Hold: 2;Step Height: 2"  airex   Functional Squat 15 reps  mini squat heel raises on airex   Other Standing Knee Exercises on airex marching 10 each   hip 3 way 10 times each on airex     Knee/Hip Exercises: Supine   Terminal Knee Extension Strengthening;Right;2 sets;10 reps;Theraband   Theraband Level (Terminal Knee Extension) Level 2 (Red)   Straight Leg Raises Strengthening;Right;1 set;10 reps  2nd set with ER     Vasopneumatic   Number Minutes Vasopneumatic  15 minutes   Vasopnuematic Location  Knee   Vasopneumatic Pressure Medium                  PT Short Term Goals - 07/28/16 1737      PT SHORT TERM GOAL #1   Title independent with initial HEP   Status Achieved           PT Long Term Goals - 08/06/16 1707      PT LONG TERM GOAL #1   Title increase AROM of the right knee to 0-120 degrees flexion   Baseline 7-105   Status Partially Met     PT LONG TERM GOAL #2   Title demonstrate stregnth of the right knee a 4+/5   Status On-going     PT LONG TERM GOAL #3   Title walk without assistive device all distances with minimal deviation   Status On-going     PT LONG TERM GOAL #4  Title decrease pain 50%   Status Partially Met               Plan - 08/06/16 1738    Clinical Impression Statement increased quad firing, VMO still lacking. progressing per prot.   PT Next Visit Plan cont per protocol      Patient will benefit from skilled therapeutic intervention in order to improve the following deficits and impairments:     Visit Diagnosis: Acute pain of right knee  Stiffness of right knee, not elsewhere classified  Difficulty in walking, not elsewhere classified  Localized swelling, mass and lump, right lower limb     Problem List Patient Active Problem List   Diagnosis Date Noted  . Chest wall pain 11/09/2014  . SOB (shortness of breath) 11/09/2014  . Left ankle pain 01/17/2014  . Well adolescent visit 10/17/2013  . Low back pain 10/17/2013  . 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 ADHD 01/23/2008    PAYSEUR,ANGIE PTA 08/06/2016, 5:39 PM  [place], [place], [address] Phone: [phone]   Fax:  [phone]  Name: Dylan Peterson MRN: [id number] Date of Birth: 04/11/1995

## 2016-08-11 ENCOUNTER — Encounter: Payer: Self-pay | Admitting: Physical Therapy

## 2016-08-11 ENCOUNTER — Ambulatory Visit: Payer: 59 | Admitting: Physical Therapy

## 2016-08-11 DIAGNOSIS — M25561 Pain in right knee: Secondary | ICD-10-CM

## 2016-08-11 DIAGNOSIS — R262 Difficulty in walking, not elsewhere classified: Secondary | ICD-10-CM

## 2016-08-11 DIAGNOSIS — M25661 Stiffness of right knee, not elsewhere classified: Secondary | ICD-10-CM | POA: Diagnosis not present

## 2016-08-11 DIAGNOSIS — R2241 Localized swelling, mass and lump, right lower limb: Secondary | ICD-10-CM | POA: Diagnosis not present
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
## 2016-08-14 ENCOUNTER — Encounter: Payer: Self-pay | Admitting: Physical Therapy

## 2016-08-14 ENCOUNTER — Ambulatory Visit: Payer: 59 | Admitting: Physical Therapy

## 2016-08-14 DIAGNOSIS — R262 Difficulty in walking, not elsewhere classified: Secondary | ICD-10-CM

## 2016-08-14 DIAGNOSIS — M25561 Pain in right knee: Secondary | ICD-10-CM

## 2016-08-14 DIAGNOSIS — M25661 Stiffness of right knee, not elsewhere classified: Secondary | ICD-10-CM

## 2016-08-14 DIAGNOSIS — R2241 Localized swelling, mass and lump, right lower limb: Secondary | ICD-10-CM

## 2016-08-14 NOTE — Therapy (Signed)
[place], [place], [address] Phone: [phone]   Fax:  [phone]  Physical Therapy Treatment  Patient Details  Name: Dylan Peterson MRN: [id number] Date of Birth: November 25, 1995 Referring Provider: Shireen Quan  Encounter Date: 08/14/2016      PT End of Session - 08/14/16 1058    Visit Number 10   Date for PT Re-Evaluation 09/12/16   PT Start Time 4010   PT Stop Time 1113   PT Time Calculation (min) 58 min      Past Medical History:  Diagnosis Date  . GERD (gastroesophageal reflux disease)    no current med.  . Right ACL tear 06/2016    Past Surgical History:  Procedure Laterality Date  . ARM HARDWARE REMOVAL Right 01/18/2006   humerus  . CLOSED REDUCTION AND PERCUTANEOUS PINNING OF HUMERUS FRACTURE Right 12/20/2005  . INGUINAL HERNIA REPAIR Right 08/03/2001  . KNEE ARTHROSCOPY WITH ANTERIOR CRUCIATE LIGAMENT (ACL) REPAIR Right 07/08/2016   Procedure: RIGHT KNEE ARTHROSCOPY WITH ANTERIOR CRUCIATE LIGAMENT (ACL) REPAIR with patellar tendon autograft;  Surgeon: Earlie Server, MD;  Location: [facility];  Service: Orthopedics;  Laterality: Right;    There were no vitals filed for this visit.      Subjective Assessment - 08/14/16 1019    Subjective "It feels better"   Currently in Pain? No/denies   Pain Score 0-No pain                         [facility] Adult PT Treatment/Exercise - 08/14/16 0001      Knee/Hip Exercises: Aerobic   Recumbent Bike 5 min full revolutions back seat 10   Nustep Nustep 60 degree flexion limit x 6 minutes     Knee/Hip Exercises: Machines for Strengthening   Cybex Leg Press 20lb 2x10, RLE only no weight   0-70 degress flexion for all motions.    Other Machine fitter press 2 blue 2x15 RLE      Knee/Hip Exercises: Standing   Forward Step Up 2 sets;Right;10 reps;Hand Hold: 0;Step Height: 4"     Knee/Hip Exercises: Seated   Long Arc Quad Right;2 sets;10 reps     Knee/Hip Exercises: Supine   Short Arc Quad Sets Strengthening;Right;2 sets;10 reps   Straight Leg Raises Strengthening;Right;10 reps;2 sets   Straight Leg Raise with External Rotation 10 reps;1 set;Right     Vasopneumatic   Number Minutes Vasopneumatic  15 minutes   Vasopnuematic Location  Knee   Vasopneumatic Pressure Medium   Vasopneumatic Temperature  33                  PT Short Term Goals - 07/28/16 1737      PT SHORT TERM GOAL #1   Title independent with initial HEP   Status Achieved           PT Long Term Goals - 08/06/16 1707      PT LONG TERM GOAL #1   Title increase AROM of the right knee to 0-120 degrees flexion   Baseline 7-105   Status Partially Met     PT LONG TERM GOAL #2   Title demonstrate stregnth of the right knee a 4+/5   Status On-going     PT LONG TERM GOAL #3   Title walk without assistive device all distances with minimal deviation   Status On-going     PT LONG TERM GOAL #4  Title decrease pain 50%   Status Partially Met               Plan - 08/14/16 1059    Clinical Impression Statement Quad weakness with SLR and SAQ remains. Pt compensates with step ups, cues required not step down through RLE. Progressed to SL on leg press with no weight.   Rehab Potential Good   PT Frequency 2x / week   PT Duration 8 weeks   PT Treatment/Interventions ADLs/Self Care Home Management;Cryotherapy;Electrical Stimulation;Gait training;Stair training;Functional mobility training;Patient/family education;Balance training;Neuromuscular re-education;Therapeutic exercise;Therapeutic activities;Visual/perceptual remediation/compensation;Manual techniques   PT Next Visit Plan cont per protocol      Patient will benefit from skilled therapeutic intervention in order to improve the following deficits and impairments:  Abnormal gait, Decreased activity tolerance, Decreased balance, Decreased endurance,  Decreased mobility, Decreased strength, Increased edema, Pain, Decreased range of motion, Difficulty walking  Visit Diagnosis: Stiffness of right knee, not elsewhere classified  Difficulty in walking, not elsewhere classified  Localized swelling, mass and lump, right lower limb  Acute pain of right knee     Problem List Patient Active Problem List   Diagnosis Date Noted  . Chest wall pain 11/09/2014  . SOB (shortness of breath) 11/09/2014  . Left ankle pain 01/17/2014  . Well adolescent visit 10/17/2013  . Low back pain 10/17/2013  . 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 ADHD 01/23/2008    Scot Jun, PTA 08/14/2016, 11:01 AM  [place], [place], [address] Phone: [phone]   Fax:  [phone]  Name: Dylan Peterson MRN: [id number] Date of Birth: 07-Jun-1995

## 2016-08-18 ENCOUNTER — Ambulatory Visit: Payer: 59 | Admitting: Physical Therapy

## 2016-08-18 DIAGNOSIS — R262 Difficulty in walking, not elsewhere classified: Secondary | ICD-10-CM

## 2016-08-18 DIAGNOSIS — R2241 Localized swelling, mass and lump, right lower limb: Secondary | ICD-10-CM | POA: Diagnosis not present

## 2016-08-18 DIAGNOSIS — M25661 Stiffness of right knee, not elsewhere classified: Secondary | ICD-10-CM

## 2016-08-18 DIAGNOSIS — M25561 Pain in right knee: Secondary | ICD-10-CM | POA: Diagnosis not present

## 2016-08-18 NOTE — Therapy (Signed)
[place], [place], [address] Phone: [phone]   Fax:  [phone]  Physical Therapy Treatment  Patient Details  Name: Dylan Peterson MRN: [id number] Date of Birth: [birth year] Referring Provider: Shireen Quan  Encounter Date: 08/18/2016      PT End of Session - 08/18/16 1648    Visit Number 11   Date for PT Re-Evaluation 09/12/16   PT Start Time 1950   PT Stop Time 1735   PT Time Calculation (min) 45 min      Past Medical History:  Diagnosis Date  . GERD (gastroesophageal reflux disease)    no current med.  . Right ACL tear 06/2016    Past Surgical History:  Procedure Laterality Date  . ARM HARDWARE REMOVAL Right 01/18/2006   humerus  . CLOSED REDUCTION AND PERCUTANEOUS PINNING OF HUMERUS FRACTURE Right 12/20/2005  . INGUINAL HERNIA REPAIR Right 08/03/2001  . KNEE ARTHROSCOPY WITH ANTERIOR CRUCIATE LIGAMENT (ACL) REPAIR Right 07/08/2016   Procedure: RIGHT KNEE ARTHROSCOPY WITH ANTERIOR CRUCIATE LIGAMENT (ACL) REPAIR with patellar tendon autograft;  Surgeon: Earlie Server, MD;  Location: [facility];  Service: Orthopedics;  Laterality: Right;    There were no vitals filed for this visit.      Subjective Assessment - 08/18/16 1650    Subjective Pt. noting occasional R medial knee soreness day after treatment however doing well today pain free.     Patient Stated Goals run and have no pain   Currently in Pain? No/denies   Pain Score 0-No pain   Multiple Pain Sites No            [facility] PT Assessment - 08/18/16 1707      Assessment   Medical Diagnosis s/p right ACL repair   Onset Date/Surgical Date 08/31/16                     [facility] Adult PT Treatment/Exercise - 08/18/16 1702      Knee/Hip Exercises: Aerobic   Nustep Nustep 60 degree flexion limit x 6 minutes      Knee/Hip Exercises: Standing   Terminal Knee Extension Limitations TKE with  ball behind the knee on wall 5" x 15 reps   Forward Step Up Right;10 reps;Hand Hold: 0;Step Height: 4"   Forward Step Up Limitations TKE with red TB     Knee/Hip Exercises: Supine   Short Arc Quad Sets Strengthening;Right;2 sets;10 reps   Short Arc [employer] Limitations with [place] E-stim for quad activation (4on/12off, with visible twitch contraction), 10'    Single Leg Bridge 2 sets;10 reps;Right  With ball squeeze + R SLR   Straight Leg Raises Strengthening;Right;10 reps;2 sets   Straight Leg Raise with External Rotation 10 reps;1 set;Right                  PT Short Term Goals - 07/28/16 1737      PT SHORT TERM GOAL #1   Title independent with initial HEP   Status Achieved           PT Long Term Goals - 08/06/16 1707      PT LONG TERM GOAL #1   Title increase AROM of the right knee to 0-120 degrees flexion   Baseline 7-105   Status Partially Met     PT LONG TERM GOAL #2   Title demonstrate stregnth of the right knee a 4+/5   Status On-going  PT LONG TERM GOAL #3   Title walk without assistive device all distances with minimal deviation   Status On-going     PT LONG TERM GOAL #4   Title decrease pain 50%   Status Partially Met               Plan - 08/18/16 1656    Clinical Impression Statement Pt. doing well today.  Therex focusing on quality quad activation with pt. still demonstrating limited contraction with standing activities.  [place] E-stim to quad with SAQ today with pt. tolerating well.  Pt. verbalizing appropriate LE fatigue following therex.     PT Treatment/Interventions ADLs/Self Care Home Management;Cryotherapy;Electrical Stimulation;Gait training;Stair training;Functional mobility training;Patient/family education;Balance training;Neuromuscular re-education;Therapeutic exercise;Therapeutic activities;Visual/perceptual remediation/compensation;Manual techniques   PT Next Visit Plan cont per protocol      Patient will benefit  from skilled therapeutic intervention in order to improve the following deficits and impairments:  Abnormal gait, Decreased activity tolerance, Decreased balance, Decreased endurance, Decreased mobility, Decreased strength, Increased edema, Pain, Decreased range of motion, Difficulty walking  Visit Diagnosis: Stiffness of right knee, not elsewhere classified  Difficulty in walking, not elsewhere classified  Localized swelling, mass and lump, right lower limb  Acute pain of right knee     Problem List Patient Active Problem List   Diagnosis Date Noted  . Chest wall pain 11/09/2014  . SOB (shortness of breath) 11/09/2014  . Left ankle pain 01/17/2014  . Well adolescent visit 10/17/2013  . Low back pain 10/17/2013  . 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 ADHD 01/23/2008    Bess Harvest, PTA 08/18/16 5:47 PM  [place] [address] [place], [place], [address] Phone: [phone]   Fax:  [phone]  Name: Dylan Peterson MRN: [id number] Date of Birth: 30-Nov-1995

## 2016-08-24 ENCOUNTER — Ambulatory Visit: Payer: 59 | Admitting: Physical Therapy

## 2016-08-24 ENCOUNTER — Encounter: Payer: Self-pay | Admitting: Physical Therapy

## 2016-08-24 DIAGNOSIS — R2241 Localized swelling, mass and lump, right lower limb: Secondary | ICD-10-CM

## 2016-08-24 DIAGNOSIS — R262 Difficulty in walking, not elsewhere classified: Secondary | ICD-10-CM | POA: Diagnosis not present

## 2016-08-24 DIAGNOSIS — M25661 Stiffness of right knee, not elsewhere classified: Secondary | ICD-10-CM | POA: Diagnosis not present

## 2016-08-24 DIAGNOSIS — M25561 Pain in right knee: Secondary | ICD-10-CM | POA: Diagnosis not present

## 2016-08-24 NOTE — Therapy (Signed)
[place], [place], [address] Phone: [phone]   Fax:  [phone]  Physical Therapy Treatment  Patient Details  Name: Dylan Peterson MRN: [id number] Date of Birth: 12-04-1995 Referring Provider: Shireen Quan  Encounter Date: 08/24/2016      PT End of Session - 08/24/16 1530    Visit Number 12   Date for PT Re-Evaluation 09/12/16   PT Start Time 1400   PT Stop Time 1459   PT Time Calculation (min) 59 min   Activity Tolerance Patient tolerated treatment well   Behavior During Therapy [facility] for tasks assessed/performed      Past Medical History:  Diagnosis Date  . GERD (gastroesophageal reflux disease)    no current med.  . Right ACL tear 06/2016    Past Surgical History:  Procedure Laterality Date  . ARM HARDWARE REMOVAL Right 01/18/2006   humerus  . CLOSED REDUCTION AND PERCUTANEOUS PINNING OF HUMERUS FRACTURE Right 12/20/2005  . INGUINAL HERNIA REPAIR Right 08/03/2001  . KNEE ARTHROSCOPY WITH ANTERIOR CRUCIATE LIGAMENT (ACL) REPAIR Right 07/08/2016   Procedure: RIGHT KNEE ARTHROSCOPY WITH ANTERIOR CRUCIATE LIGAMENT (ACL) REPAIR with patellar tendon autograft;  Surgeon: Earlie Server, MD;  Location: [facility];  Service: Orthopedics;  Laterality: Right;    There were no vitals filed for this visit.      Subjective Assessment - 08/24/16 1402    Subjective Patient reports that he got out of bed on Saturday AM, he reports that the leg twisted and he fell, he reports that he has been having some increased pain since that time   Currently in Pain? Yes   Pain Score 3    Pain Location Knee   Pain Orientation Right   Aggravating Factors  falling increased pain                         [facility] Adult PT Treatment/Exercise - 08/24/16 0001      Knee/Hip Exercises: Aerobic   Tread Mill 4 minutes at 1.4 mph   Recumbent Bike 5 min full revolutions back seat   #8   Nustep Nustep level 5 x 6 minutes     Knee/Hip Exercises: Machines for Strengthening   Cybex Leg Press 40lb 2x10, RLE only no weight , 20# right leg small motions and trying to get him to not compensate with the hip     Knee/Hip Exercises: Standing   Terminal Knee Extension Limitations TKE with ball behind the knee on wall 5" x 15 reps   Forward Step Up Right;Hand Hold: 0;Step Height: 4";20 reps   Step Down 20 reps;Step Height: 4"   Walking with Sports Cord all directions with 30# total   Other Standing Knee Exercises on airex marching 10 each, ball tossing, eyes closed, head turns   Other Standing Knee Exercises --     Knee/Hip Exercises: Supine   Short Arc Quad Sets Strengthening;Right   Short Arc Quad Sets Limitations with [place] estim 2# and no weight 4 seconds on 12 seconds off   Straight Leg Raises Strengthening;Right;10 reps;2 sets                  PT Short Term Goals - 07/28/16 1737      PT SHORT TERM GOAL #1   Title independent with initial HEP   Status Achieved           PT Long Term Goals -  08/06/16 1707      PT LONG TERM GOAL #1   Title increase AROM of the right knee to 0-120 degrees flexion   Baseline 7-105   Status Partially Met     PT LONG TERM GOAL #2   Title demonstrate stregnth of the right knee a 4+/5   Status On-going     PT LONG TERM GOAL #3   Title walk without assistive device all distances with minimal deviation   Status On-going     PT LONG TERM GOAL #4   Title decrease pain 50%   Status Partially Met               Plan - 08/24/16 1531    Clinical Impression Statement Patient is getting stronger with his quad, he is still very weak but is no longer using his hands or the other leg to move the surgical leg.  He has about a 10-14 degree extensor lag,  I tested anterior drawer, it was solid but with some anterior medial knee pain   PT Next Visit Plan try to push his strength staying within the protocol   Consulted  and Agree with Plan of Care Patient      Patient will benefit from skilled therapeutic intervention in order to improve the following deficits and impairments:  Abnormal gait, Decreased activity tolerance, Decreased balance, Decreased endurance, Decreased mobility, Decreased strength, Increased edema, Pain, Decreased range of motion, Difficulty walking  Visit Diagnosis: Stiffness of right knee, not elsewhere classified  Difficulty in walking, not elsewhere classified  Localized swelling, mass and lump, right lower limb     Problem List Patient Active Problem List   Diagnosis Date Noted  . Chest wall pain 11/09/2014  . SOB (shortness of breath) 11/09/2014  . Left ankle pain 01/17/2014  . Well adolescent visit 10/17/2013  . Low back pain 10/17/2013  . 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 ADHD 01/23/2008    Dylan Boast., PT 08/24/2016, 3:33 PM  [place] [address] [place], [place], [address] Phone: [phone]   Fax:  [phone]  Name: Dylan Peterson MRN: [id number] Date of Birth: Nov 29, 1995

## 2016-08-27 ENCOUNTER — Ambulatory Visit: Payer: 59 | Admitting: Physical Therapy

## 2016-08-27 DIAGNOSIS — R262 Difficulty in walking, not elsewhere classified: Secondary | ICD-10-CM | POA: Diagnosis not present

## 2016-08-27 DIAGNOSIS — R2241 Localized swelling, mass and lump, right lower limb: Secondary | ICD-10-CM

## 2016-08-27 DIAGNOSIS — M25561 Pain in right knee: Secondary | ICD-10-CM | POA: Diagnosis not present

## 2016-08-27 DIAGNOSIS — M25661 Stiffness of right knee, not elsewhere classified: Secondary | ICD-10-CM | POA: Diagnosis not present

## 2016-08-27 NOTE — Therapy (Signed)
[place], [place], [address] Phone: [phone]   Fax:  [phone]  Physical Therapy Treatment  Patient Details  Name: Dylan Peterson MRN: [id number] Date of Birth: Jul 26, 1995 Referring Provider: Shireen Quan  Encounter Date: 08/27/2016      PT End of Session - 08/27/16 1135    Visit Number 13   Date for PT Re-Evaluation 09/12/16   PT Start Time 1050   PT Stop Time 1145   PT Time Calculation (min) 55 min      Past Medical History:  Diagnosis Date  . GERD (gastroesophageal reflux disease)    no current med.  . Right ACL tear 06/2016    Past Surgical History:  Procedure Laterality Date  . ARM HARDWARE REMOVAL Right 01/18/2006   humerus  . CLOSED REDUCTION AND PERCUTANEOUS PINNING OF HUMERUS FRACTURE Right 12/20/2005  . INGUINAL HERNIA REPAIR Right 08/03/2001  . KNEE ARTHROSCOPY WITH ANTERIOR CRUCIATE LIGAMENT (ACL) REPAIR Right 07/08/2016   Procedure: RIGHT KNEE ARTHROSCOPY WITH ANTERIOR CRUCIATE LIGAMENT (ACL) REPAIR with patellar tendon autograft;  Surgeon: Earlie Server, MD;  Location: [facility];  Service: Orthopedics;  Laterality: Right;    There were no vitals filed for this visit.      Subjective Assessment - 08/27/16 1051    Subjective alittle more sore this morning, antalgic gait with brace on    Currently in Pain? Yes   Pain Score 5    Pain Location Knee   Pain Orientation Right            [facility] PT Assessment - 08/27/16 0001      AROM   AROM Assessment Site Knee   Right/Left Knee Right   Right Knee Extension 4   Right Knee Flexion 111                     [facility] Adult PT Treatment/Exercise - 08/27/16 0001      Knee/Hip Exercises: Aerobic   Elliptical 3 fwd/3back I 5R 4   Tread Mill 5 minutes at 1.5 mph   Nustep Nustep level 5 x 6 minutes     Knee/Hip Exercises: Machines for Strengthening   Cybex Leg Press 40lb 2x10, RLE  only no weight , 20# right leg small motions and trying to get him to not compensate with the hip   Other Machine cable press down 50# 2 sets 15     Knee/Hip Exercises: Standing   Terminal Knee Extension Limitations TKE with ball behind the knee on wall 5" x 15 reps   Forward Step Up Right;15 reps;Step Height: 4";Hand Hold: 1   Step Down Right;15 reps;Hand Hold: 0;Step Height: 4"     Acupuncturist Location VMS with SAQ 2# 10 min 5/5                  PT Short Term Goals - 07/28/16 1737      PT SHORT TERM GOAL #1   Title independent with initial HEP   Status Achieved           PT Long Term Goals - 08/27/16 1123      PT LONG TERM GOAL #1   Title increase AROM of the right knee to 0-120 degrees flexion   Baseline 4-111 sitting   Status Partially Met     PT LONG TERM GOAL #2   Title demonstrate stregnth of the right knee a 4+/5  Status On-going     PT LONG TERM GOAL #3   Title walk without assistive device all distances with minimal deviation   Status On-going     PT LONG TERM GOAL #4   Title decrease pain 50%   Baseline 75% better   Status Achieved               Plan - 08/27/16 1135    Clinical Impression Statement progressing woth ROM and decreased pain. progressing woth goals. pt still wth very weak quad with compensation.   PT Next Visit Plan try to push his strength staying within the protocol      Patient will benefit from skilled therapeutic intervention in order to improve the following deficits and impairments:     Visit Diagnosis: Stiffness of right knee, not elsewhere classified  Difficulty in walking, not elsewhere classified  Localized swelling, mass and lump, right lower limb  Acute pain of right knee     Problem List Patient Active Problem List   Diagnosis Date Noted  . Chest wall pain 11/09/2014  . SOB (shortness of breath) 11/09/2014  . Left ankle pain 01/17/2014  . Well adolescent  visit 10/17/2013  . Low back pain 10/17/2013  . 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 ADHD 01/23/2008    PAYSEUR,ANGIE PTA 08/27/2016, 11:36 AM  [place], [place], [address] Phone: [phone]   Fax:  [phone]  Name: KANOA PHILLIPPI MRN: [id number] Date of Birth: 1995/09/07

## 2016-09-01 ENCOUNTER — Ambulatory Visit: Payer: 59 | Attending: Orthopedic Surgery | Admitting: Physical Therapy

## 2016-09-01 ENCOUNTER — Encounter: Payer: Self-pay | Admitting: Physical Therapy

## 2016-09-01 DIAGNOSIS — R262 Difficulty in walking, not elsewhere classified: Secondary | ICD-10-CM | POA: Insufficient documentation

## 2016-09-01 DIAGNOSIS — M25661 Stiffness of right knee, not elsewhere classified: Secondary | ICD-10-CM | POA: Insufficient documentation

## 2016-09-01 DIAGNOSIS — R2241 Localized swelling, mass and lump, right lower limb: Secondary | ICD-10-CM | POA: Insufficient documentation

## 2016-09-01 DIAGNOSIS — S83512D Sprain of anterior cruciate ligament of left knee, subsequent encounter: Secondary | ICD-10-CM | POA: Diagnosis not present

## 2016-09-01 DIAGNOSIS — M25561 Pain in right knee: Secondary | ICD-10-CM | POA: Insufficient documentation

## 2016-09-01 NOTE — Therapy (Signed)
[place], [place], [address] Phone: [phone]   Fax:  [phone]  Physical Therapy Treatment  Patient Details  Name: Dylan Peterson MRN: [id number] Date of Birth: November 02, 1995 Referring Provider: Shireen Quan  Encounter Date: 09/01/2016      PT End of Session - 09/01/16 1226    Visit Number 14   Date for PT Re-Evaluation 09/12/16   PT Start Time 5277   PT Stop Time 1237   PT Time Calculation (min) 52 min   Activity Tolerance Patient tolerated treatment well   Behavior During Therapy [facility] for tasks assessed/performed      Past Medical History:  Diagnosis Date  . GERD (gastroesophageal reflux disease)    no current med.  . Right ACL tear 06/2016    Past Surgical History:  Procedure Laterality Date  . ARM HARDWARE REMOVAL Right 01/18/2006   humerus  . CLOSED REDUCTION AND PERCUTANEOUS PINNING OF HUMERUS FRACTURE Right 12/20/2005  . INGUINAL HERNIA REPAIR Right 08/03/2001  . KNEE ARTHROSCOPY WITH ANTERIOR CRUCIATE LIGAMENT (ACL) REPAIR Right 07/08/2016   Procedure: RIGHT KNEE ARTHROSCOPY WITH ANTERIOR CRUCIATE LIGAMENT (ACL) REPAIR with patellar tendon autograft;  Surgeon: Earlie Server, MD;  Location: [facility];  Service: Orthopedics;  Laterality: Right;    There were no vitals filed for this visit.      Subjective Assessment - 09/01/16 1145    Subjective "Good"   Currently in Pain? Yes   Pain Score 3    Pain Location Knee   Pain Orientation Right;Posterior                         [facility] Adult PT Treatment/Exercise - 09/01/16 0001      Knee/Hip Exercises: Aerobic   Tread Mill 4 minutes at 1.5 mph   Recumbent Bike 5 min full revolutions back seat  #8   Nustep Nustep level 5 x 6 minutes     Knee/Hip Exercises: Machines for Strengthening   Cybex Leg Press 40lb 2x10, RLE only no weight , 20# right leg small ROM 2x10    Other Machine cable press  down 50# 2 sets 15     Knee/Hip Exercises: Standing   Walking with Sports Cord all directions with 30# total   Other Standing Knee Exercises 4 in step up yellow tband TKE 2x10     Modalities   Modalities Cryotherapy     Cryotherapy   Number Minutes Cryotherapy 10 Minutes   Cryotherapy Location Knee   Type of Cryotherapy Ice pack                  PT Short Term Goals - 07/28/16 1737      PT SHORT TERM GOAL #1   Title independent with initial HEP   Status Achieved           PT Long Term Goals - 08/27/16 1123      PT LONG TERM GOAL #1   Title increase AROM of the right knee to 0-120 degrees flexion   Baseline 4-111 sitting   Status Partially Met     PT LONG TERM GOAL #2   Title demonstrate stregnth of the right knee a 4+/5   Status On-going     PT LONG TERM GOAL #3   Title walk without assistive device all distances with minimal deviation   Status On-going     PT LONG TERM GOAL #4  Title decrease pain 50%   Baseline 75% better   Status Achieved               Plan - 09/01/16 1227    Clinical Impression Statement Pt  quad is very weak and he often compensates, Visible shaking with all RLE isolated interventions. Does reports some R knee pain with 4 inch step up. Cues to bend and push off with RLE during resisted gait.   Rehab Potential Good   PT Frequency 2x / week   PT Duration 8 weeks   PT Treatment/Interventions ADLs/Self Care Home Management;Cryotherapy;Electrical Stimulation;Gait training;Stair training;Functional mobility training;Patient/family education;Balance training;Neuromuscular re-education;Therapeutic exercise;Therapeutic activities;Visual/perceptual remediation/compensation;Manual techniques   PT Next Visit Plan try to push his strength staying within the protocol      Patient will benefit from skilled therapeutic intervention in order to improve the following deficits and impairments:  Abnormal gait, Decreased activity tolerance,  Decreased balance, Decreased endurance, Decreased mobility, Decreased strength, Increased edema, Pain, Decreased range of motion, Difficulty walking  Visit Diagnosis: Stiffness of right knee, not elsewhere classified  Difficulty in walking, not elsewhere classified  Localized swelling, mass and lump, right lower limb  Acute pain of right knee     Problem List Patient Active Problem List   Diagnosis Date Noted  . Chest wall pain 11/09/2014  . SOB (shortness of breath) 11/09/2014  . Left ankle pain 01/17/2014  . Well adolescent visit 10/17/2013  . Low back pain 10/17/2013  . 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 ADHD 01/23/2008    Scot Jun 09/01/2016, 12:29 PM  [place], [place], [address] Phone: [phone]   Fax:  [phone]  Name: Dylan Peterson MRN: [id number] Date of Birth: [birth year]

## 2016-09-03 ENCOUNTER — Encounter: Payer: Self-pay | Admitting: Physical Therapy

## 2016-09-03 ENCOUNTER — Ambulatory Visit: Payer: 59 | Admitting: Physical Therapy

## 2016-09-03 DIAGNOSIS — M25661 Stiffness of right knee, not elsewhere classified: Secondary | ICD-10-CM

## 2016-09-03 DIAGNOSIS — R2241 Localized swelling, mass and lump, right lower limb: Secondary | ICD-10-CM

## 2016-09-03 DIAGNOSIS — M25561 Pain in right knee: Secondary | ICD-10-CM

## 2016-09-03 DIAGNOSIS — R262 Difficulty in walking, not elsewhere classified: Secondary | ICD-10-CM | POA: Diagnosis not present

## 2016-09-03 NOTE — Therapy (Signed)
[facility] [address] [place], [place], [address] Phone: [phone]   Fax:  [phone]  Physical Therapy Treatment  Patient Details  Name: Dylan Peterson MRN: [id number] Date of Birth: [birth year] Referring Provider: York Griceafferey  Encounter Date: 09/03/2016      PT End of Session - 09/03/16 1559    Visit Number 15   Date for PT Re-Evaluation 09/12/16   PT Start Time 1515   PT Stop Time 1613   PT Time Calculation (min) 58 min   Behavior During Therapy [facility] for tasks assessed/performed      Past Medical History:  Diagnosis Date  . GERD (gastroesophageal reflux disease)    no current med.  . Right ACL tear 06/2016    Past Surgical History:  Procedure Laterality Date  . ARM HARDWARE REMOVAL Right 01/18/2006   humerus  . CLOSED REDUCTION AND PERCUTANEOUS PINNING OF HUMERUS FRACTURE Right 12/20/2005  . INGUINAL HERNIA REPAIR Right 08/03/2001  . KNEE ARTHROSCOPY WITH ANTERIOR CRUCIATE LIGAMENT (ACL) REPAIR Right 07/08/2016   Procedure: RIGHT KNEE ARTHROSCOPY WITH ANTERIOR CRUCIATE LIGAMENT (ACL) REPAIR with patellar tendon autograft;  Surgeon: Frederico Hammanaffrey, Daniel, MD;  Location: [facility];  Service: Orthopedics;  Laterality: Right;    There were no vitals filed for this visit.      Subjective Assessment - 09/03/16 1518    Subjective "Good today, a little bit sore"   Currently in Pain? No/denies   Pain Score 0-No pain                         [facility] Adult PT Treatment/Exercise - 09/03/16 0001      High Level Balance   High Level Balance Comments RLE SLS cone taps      Knee/Hip Exercises: Aerobic   Elliptical 3 fwd/3back I 5R 4   Recumbent Bike 5 min full revolutions back seat  #8   Nustep Nustep level 4 x 6 minutes     Knee/Hip Exercises: Machines for Strengthening   Cybex Leg Press 40lb 2x15, RLE only no weight , 20# right leg small ROM 2x10; RLE heel raises 20lb 2x10       Knee/Hip Exercises: Standing   Terminal Knee Extension Limitations TKE with ball behind the knee on wall 2 x15 reps   Forward Step Up Right;15 reps;Step Height: 6";Hand Hold: 0;2 sets     Knee/Hip Exercises: Seated   Hamstring Curl Strengthening;Right;2 sets;15 reps   Hamstring Limitations green tband      Vasopneumatic   Number Minutes Vasopneumatic  15 minutes   Vasopnuematic Location  Knee   Vasopneumatic Pressure Medium   Vasopneumatic Temperature  33                  PT Short Term Goals - 07/28/16 1737      PT SHORT TERM GOAL #1   Title independent with initial HEP   Status Achieved           PT Long Term Goals - 09/03/16 1559      PT LONG TERM GOAL #3   Title walk without assistive device all distances with minimal deviation   Status On-going     PT LONG TERM GOAL #4   Status Achieved               Plan - 09/03/16 1600    Clinical Impression Statement Pt has good quad contraction with standing TKE, Antalgic  gait remains, R quad is visibly weak with most interventions. Does report a little pain.    Rehab Potential Good   PT Frequency 2x / week   PT Duration 8 weeks   PT Treatment/Interventions ADLs/Self Care Home Management;Cryotherapy;Electrical Stimulation;Gait training;Stair training;Functional mobility training;Patient/family education;Balance training;Neuromuscular re-education;Therapeutic exercise;Therapeutic activities;Visual/perceptual remediation/compensation;Manual techniques   PT Next Visit Plan try to push his strength staying within the protocol      Patient will benefit from skilled therapeutic intervention in order to improve the following deficits and impairments:  Abnormal gait, Decreased activity tolerance, Decreased balance, Decreased endurance, Decreased mobility, Decreased strength, Increased edema, Pain, Decreased range of motion, Difficulty walking  Visit Diagnosis: Stiffness of right knee, not elsewhere  classified  Difficulty in walking, not elsewhere classified  Localized swelling, mass and lump, right lower limb  Acute pain of right knee     Problem List Patient Active Problem List   Diagnosis Date Noted  . Chest wall pain 11/09/2014  . SOB (shortness of breath) 11/09/2014  . Left ankle pain 01/17/2014  . Well adolescent visit 10/17/2013  . Low back pain 10/17/2013  . 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 ADHD 01/23/2008    Grayce Sessions, PTA 09/03/2016, 4:04 PM  [facility] [place] [address] [place], [place], [address] Phone: [phone]   Fax:  [phone]  Name: Dylan Peterson MRN: [id number] Date of Birth: Oct 21, 1995

## 2016-09-08 ENCOUNTER — Encounter: Payer: Self-pay | Admitting: Physical Therapy

## 2016-09-08 ENCOUNTER — Ambulatory Visit: Payer: 59 | Admitting: Physical Therapy

## 2016-09-08 DIAGNOSIS — R2241 Localized swelling, mass and lump, right lower limb: Secondary | ICD-10-CM | POA: Diagnosis not present

## 2016-09-08 DIAGNOSIS — M25561 Pain in right knee: Secondary | ICD-10-CM

## 2016-09-08 DIAGNOSIS — R262 Difficulty in walking, not elsewhere classified: Secondary | ICD-10-CM

## 2016-09-08 DIAGNOSIS — M25661 Stiffness of right knee, not elsewhere classified: Secondary | ICD-10-CM

## 2016-09-08 NOTE — Therapy (Signed)
[facility] [address] [place], [place], [address] Phone: [phone]   Fax:  [phone]  Physical Therapy Treatment  Patient Details  Name: Dylan Peterson MRN: [id number] Date of Birth: 12-29-1995 Referring Provider: York Griceafferey  Encounter Date: 09/08/2016      PT End of Session - 09/08/16 1210    Visit Number 16   Date for PT Re-Evaluation 09/12/16   PT Start Time 1123   PT Stop Time 1225   PT Time Calculation (min) 62 min   Activity Tolerance Patient tolerated treatment well   Behavior During Therapy [facility] for tasks assessed/performed      Past Medical History:  Diagnosis Date  . GERD (gastroesophageal reflux disease)    no current med.  . Right ACL tear 06/2016    Past Surgical History:  Procedure Laterality Date  . ARM HARDWARE REMOVAL Right 01/18/2006   humerus  . CLOSED REDUCTION AND PERCUTANEOUS PINNING OF HUMERUS FRACTURE Right 12/20/2005  . INGUINAL HERNIA REPAIR Right 08/03/2001  . KNEE ARTHROSCOPY WITH ANTERIOR CRUCIATE LIGAMENT (ACL) REPAIR Right 07/08/2016   Procedure: RIGHT KNEE ARTHROSCOPY WITH ANTERIOR CRUCIATE LIGAMENT (ACL) REPAIR with patellar tendon autograft;  Surgeon: Frederico Hammanaffrey, Daniel, MD;  Location: [facility];  Service: Orthopedics;  Laterality: Right;    There were no vitals filed for this visit.      Subjective Assessment - 09/08/16 1123    Subjective "Good"   Currently in Pain? No/denies   Pain Score 0-No pain                         [facility] Adult PT Treatment/Exercise - 09/08/16 0001      High Level Balance   High Level Balance Comments RLE SLS cone taps      Knee/Hip Exercises: Aerobic   Elliptical 3 fwd/3back I10 R5   Recumbent Bike 6 min L2 seat 8     Knee/Hip Exercises: Machines for Strengthening   Cybex Knee Extension RLE eccentrics 5lb 2x10    Cybex Knee Flexion 15lb RLE 2x10    Cybex Leg Press 40lb 2x15, 20# right leg 2x10      Knee/Hip Exercises: Standing   Terminal Knee Extension Limitations TKE with ball behind the knee on wall 2 x15 reps   Lateral Step Up Right;2 sets;10 reps;Hand Hold: 0;Step Height: 6"   Other Standing Knee Exercises Controlled desents 6 in 2x10                  PT Short Term Goals - 07/28/16 1737      PT SHORT TERM GOAL #1   Title independent with initial HEP   Status Achieved           PT Long Term Goals - 09/03/16 1559      PT LONG TERM GOAL #3   Title walk without assistive device all distances with minimal deviation   Status On-going     PT LONG TERM GOAL #4   Status Achieved               Plan - 09/08/16 1211    Clinical Impression Statement Pt R quad remains weak with both machine and functional interventions. Does report some pain with controlled descents. Fatigues quick with most interventions.   Rehab Potential Good   PT Frequency 2x / week   PT Duration 8 weeks   PT Treatment/Interventions ADLs/Self Care Home Management;Cryotherapy;Electrical Stimulation;Gait training;Stair training;Functional mobility  training;Patient/family education;Balance training;Neuromuscular re-education;Therapeutic exercise;Therapeutic activities;Visual/perceptual remediation/compensation;Manual techniques   PT Next Visit Plan try to push his strength staying within the protocol      Patient will benefit from skilled therapeutic intervention in order to improve the following deficits and impairments:  Abnormal gait, Decreased activity tolerance, Decreased balance, Decreased endurance, Decreased mobility, Decreased strength, Increased edema, Pain, Decreased range of motion, Difficulty walking  Visit Diagnosis: Stiffness of right knee, not elsewhere classified  Difficulty in walking, not elsewhere classified  Localized swelling, mass and lump, right lower limb  Acute pain of right knee     Problem List Patient Active Problem List   Diagnosis Date Noted  .  Chest wall pain 11/09/2014  . SOB (shortness of breath) 11/09/2014  . Left ankle pain 01/17/2014  . Well adolescent visit 10/17/2013  . Low back pain 10/17/2013  . 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 ADHD 01/23/2008    Dylan Peterson, PTA 09/08/2016, 12:13 PM  [facility] [place] [address] [place], [place], [address] Phone: [phone]   Fax:  [phone]  Name: Dylan Peterson MRN: [id number] Date of Birth: Jan 16, 1996

## 2016-09-10 ENCOUNTER — Encounter: Payer: Self-pay | Admitting: Physical Therapy

## 2016-09-10 ENCOUNTER — Ambulatory Visit: Payer: 59 | Admitting: Physical Therapy

## 2016-09-10 DIAGNOSIS — R262 Difficulty in walking, not elsewhere classified: Secondary | ICD-10-CM | POA: Diagnosis not present

## 2016-09-10 DIAGNOSIS — R2241 Localized swelling, mass and lump, right lower limb: Secondary | ICD-10-CM | POA: Diagnosis not present

## 2016-09-10 DIAGNOSIS — M25561 Pain in right knee: Secondary | ICD-10-CM | POA: Diagnosis not present

## 2016-09-10 DIAGNOSIS — M25661 Stiffness of right knee, not elsewhere classified: Secondary | ICD-10-CM

## 2016-09-10 NOTE — Therapy (Signed)
[facility] [address] [place], [place], [address] Phone: [phone]   Fax:  [phone]  Physical Therapy Treatment  Patient Details  Name: Dylan Peterson MRN: [id number] Date of Birth: [birth year] Referring Provider: York Griceafferey  Encounter Date: 09/10/2016      PT End of Session - 09/10/16 1653    Visit Number 17   Date for PT Re-Evaluation 09/12/16   PT Start Time 1600   PT Stop Time 1705   PT Time Calculation (min) 65 min   Activity Tolerance Patient tolerated treatment well   Behavior During Therapy [facility] for tasks assessed/performed      Past Medical History:  Diagnosis Date  . GERD (gastroesophageal reflux disease)    no current med.  . Right ACL tear 06/2016    Past Surgical History:  Procedure Laterality Date  . ARM HARDWARE REMOVAL Right 01/18/2006   humerus  . CLOSED REDUCTION AND PERCUTANEOUS PINNING OF HUMERUS FRACTURE Right 12/20/2005  . INGUINAL HERNIA REPAIR Right 08/03/2001  . KNEE ARTHROSCOPY WITH ANTERIOR CRUCIATE LIGAMENT (ACL) REPAIR Right 07/08/2016   Procedure: RIGHT KNEE ARTHROSCOPY WITH ANTERIOR CRUCIATE LIGAMENT (ACL) REPAIR with patellar tendon autograft;  Surgeon: Frederico Hammanaffrey, Daniel, MD;  Location: [facility];  Service: Orthopedics;  Laterality: Right;    There were no vitals filed for this visit.      Subjective Assessment - 09/10/16 1604    Subjective "Good"   Currently in Pain? No/denies   Pain Score 0-No pain                         [facility] Adult PT Treatment/Exercise - 09/10/16 0001      Knee/Hip Exercises: Aerobic   Tread Mill 2 min 2 mph, backwards 2 min 1.3mph   Recumbent Bike 6 min L2 seat 8     Knee/Hip Exercises: Machines for Strengthening   Cybex Knee Extension RLE eccentrics 5lb 2x10    Cybex Knee Flexion 15lb RLE 2x10    Cybex Leg Press 50lb 2x15, 30# right leg 2x10     Knee/Hip Exercises: Standing   Other Standing Knee  Exercises Controlled desents 6 in 2x10   Other Standing Knee Exercises 15lb stepping skaters x10 each, resisted walk to explosive step up x10     Vasopneumatic   Number Minutes Vasopneumatic  15 minutes   Vasopnuematic Location  Knee   Vasopneumatic Pressure Medium   Vasopneumatic Temperature  33                  PT Short Term Goals - 07/28/16 1737      PT SHORT TERM GOAL #1   Title independent with initial HEP   Status Achieved           PT Long Term Goals - 09/03/16 1559      PT LONG TERM GOAL #3   Title walk without assistive device all distances with minimal deviation   Status On-going     PT LONG TERM GOAL #4   Status Achieved               Plan - 09/10/16 1654    Clinical Impression Statement Pt able to do all of today's exercises, does report some pain with controlled descents. Weakness and instability with resisted walk to step up.   Rehab Potential Good   PT Frequency 2x / week   PT Duration 8 weeks   PT Treatment/Interventions ADLs/Self  Care Home Management;Cryotherapy;Electrical Stimulation;Gait training;Stair training;Functional mobility training;Patient/family education;Balance training;Neuromuscular re-education;Therapeutic exercise;Therapeutic activities;Visual/perceptual remediation/compensation;Manual techniques   PT Next Visit Plan try to push his strength staying within the protocol      Patient will benefit from skilled therapeutic intervention in order to improve the following deficits and impairments:  Abnormal gait, Decreased activity tolerance, Decreased balance, Decreased endurance, Decreased mobility, Decreased strength, Increased edema, Pain, Decreased range of motion, Difficulty walking  Visit Diagnosis: Stiffness of right knee, not elsewhere classified  Difficulty in walking, not elsewhere classified  Localized swelling, mass and lump, right lower limb  Acute pain of right knee     Problem List Patient Active  Problem List   Diagnosis Date Noted  . Chest wall pain 11/09/2014  . SOB (shortness of breath) 11/09/2014  . Left ankle pain 01/17/2014  . Well adolescent visit 10/17/2013  . Low back pain 10/17/2013  . 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 ADHD 01/23/2008    Dylan Peterson, Dylan Peterson 09/10/2016, 5:02 PM  [facility] [place] [address] [place], [place], [address] Phone: [phone]   Fax:  [phone]  Name: Dylan Peterson MRN: [id number] Date of Birth: 03/22/1995

## 2016-09-15 ENCOUNTER — Encounter: Payer: Self-pay | Admitting: Physical Therapy

## 2016-09-15 ENCOUNTER — Ambulatory Visit: Payer: 59 | Admitting: Physical Therapy

## 2016-09-15 DIAGNOSIS — M25661 Stiffness of right knee, not elsewhere classified: Secondary | ICD-10-CM | POA: Diagnosis not present

## 2016-09-15 DIAGNOSIS — R262 Difficulty in walking, not elsewhere classified: Secondary | ICD-10-CM | POA: Diagnosis not present

## 2016-09-15 DIAGNOSIS — M25561 Pain in right knee: Secondary | ICD-10-CM | POA: Diagnosis not present

## 2016-09-15 DIAGNOSIS — R2241 Localized swelling, mass and lump, right lower limb: Secondary | ICD-10-CM

## 2016-09-15 NOTE — Therapy (Signed)
[facility] [place] [address] [place], [place], [address] Phone: [phone]   Fax:  [phone]  Physical Therapy Treatment  Patient Details  Name: Dylan Peterson MRN: [id number] Date of Birth: 18-May-1995 Referring Provider: York Grice  Encounter Date: 09/15/2016      PT End of Session - 09/15/16 1142    Visit Number 18   Date for PT Re-Evaluation 09/12/16   PT Start Time 1055   PT Stop Time 1157   PT Time Calculation (min) 62 min   Activity Tolerance Patient tolerated treatment well   Behavior During Therapy [facility] for tasks assessed/performed      Past Medical History:  Diagnosis Date  . GERD (gastroesophageal reflux disease)    no current med.  . Right ACL tear 06/2016    Past Surgical History:  Procedure Laterality Date  . ARM HARDWARE REMOVAL Right 01/18/2006   humerus  . CLOSED REDUCTION AND PERCUTANEOUS PINNING OF HUMERUS FRACTURE Right 12/20/2005  . INGUINAL HERNIA REPAIR Right 08/03/2001  . KNEE ARTHROSCOPY WITH ANTERIOR CRUCIATE LIGAMENT (ACL) REPAIR Right 07/08/2016   Procedure: RIGHT KNEE ARTHROSCOPY WITH ANTERIOR CRUCIATE LIGAMENT (ACL) REPAIR with patellar tendon autograft;  Surgeon: Frederico Hamman, MD;  Location: [facility];  Service: Orthopedics;  Laterality: Right;    There were no vitals filed for this visit.      Subjective Assessment - 09/15/16 1054    Subjective "Good"   Currently in Pain? No/denies   Pain Score 0-No pain            [facility] PT Assessment - 09/15/16 0001      AROM   AROM Assessment Site Knee   Right/Left Knee Right   Right Knee Extension 5   Right Knee Flexion 115                     [facility] Adult PT Treatment/Exercise - 09/15/16 0001      Knee/Hip Exercises: Aerobic   Tread Mill 2 min 2 mph, backwards 2 min 1.   Recumbent Bike 6 min L2 seat 8     Knee/Hip Exercises: Machines for Strengthening   Cybex Knee Extension RLE  eccentrics 5lb 2x10    Cybex Knee Flexion 15lb RLE 2x15   Cybex Leg Press 30# right leg 3x10     Knee/Hip Exercises: Supine   Short Arc Quad Sets Strengthening;Right;2 sets;15 reps   Short Arc Quad Sets Limitations 2   Straight Leg Raises Strengthening;Right;10 reps;2 sets   Straight Leg Raises Limitations 2   Other Supine Knee/Hip Exercises Prone TKE 2x10; Prone knee tucks 2x10      Vasopneumatic   Number Minutes Vasopneumatic  15 minutes   Vasopnuematic Location  Knee   Vasopneumatic Pressure Medium   Vasopneumatic Temperature  33                  PT Short Term Goals - 07/28/16 1737      PT SHORT TERM GOAL #1   Title independent with initial HEP   Status Achieved           PT Long Term Goals - 09/03/16 1559      PT LONG TERM GOAL #3   Title walk without assistive device all distances with minimal deviation   Status On-going     PT LONG TERM GOAL #4   Status Achieved               Plan -  09/15/16 1143    Clinical Impression Statement Pt with an increase in R knee active flexion but still lacks some TKE. Extensor lag with SLR, reports some inferior patella knee pain with SAQ. Good effort with SL strengthening.   Rehab Potential Good   PT Frequency 2x / week   PT Duration 8 weeks   PT Treatment/Interventions ADLs/Self Care Home Management;Cryotherapy;Electrical Stimulation;Gait training;Stair training;Functional mobility training;Patient/family education;Balance training;Neuromuscular re-education;Therapeutic exercise;Therapeutic activities;Visual/perceptual remediation/compensation;Manual techniques   PT Next Visit Plan try to push his strength staying within the protocol      Patient will benefit from skilled therapeutic intervention in order to improve the following deficits and impairments:  Abnormal gait, Decreased activity tolerance, Decreased balance, Decreased endurance, Decreased mobility, Decreased strength, Increased edema, Pain, Decreased  range of motion, Difficulty walking  Visit Diagnosis: Stiffness of right knee, not elsewhere classified  Localized swelling, mass and lump, right lower limb  Acute pain of right knee  Difficulty in walking, not elsewhere classified     Problem List Patient Active Problem List   Diagnosis Date Noted  . Chest wall pain 11/09/2014  . SOB (shortness of breath) 11/09/2014  . Left ankle pain 01/17/2014  . Well adolescent visit 10/17/2013  . Low back pain 10/17/2013  . 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 ADHD 01/23/2008    Grayce Sessionsonald G Arvel Oquinn, PTA 09/15/2016, 11:45 AM  [facility] [address] [place], [place], [address] Phone: [phone]   Fax:  [phone]  Name: Dylan Peterson MRN: [id number] Date of Birth: Aug 10, 1995

## 2016-09-17 ENCOUNTER — Ambulatory Visit: Payer: 59 | Admitting: Physical Therapy

## 2016-09-17 ENCOUNTER — Encounter: Payer: Self-pay | Admitting: Physical Therapy

## 2016-09-17 DIAGNOSIS — M25561 Pain in right knee: Secondary | ICD-10-CM

## 2016-09-17 DIAGNOSIS — R262 Difficulty in walking, not elsewhere classified: Secondary | ICD-10-CM

## 2016-09-17 DIAGNOSIS — M25661 Stiffness of right knee, not elsewhere classified: Secondary | ICD-10-CM | POA: Diagnosis not present

## 2016-09-17 DIAGNOSIS — R2241 Localized swelling, mass and lump, right lower limb: Secondary | ICD-10-CM

## 2016-09-17 NOTE — Therapy (Signed)
[facility] [address] [place], [place], [address] Phone: [phone]   Fax:  [phone]  Physical Therapy Treatment  Patient Details  Name: Dylan Peterson MRN: [id number] Date of Birth: 1995/06/25 Referring Provider: York Grice  Encounter Date: 09/17/2016      PT End of Session - 09/17/16 1014    Visit Number 19   PT Start Time 0930   PT Stop Time 1022   PT Time Calculation (min) 52 min   Activity Tolerance Patient tolerated treatment well   Behavior During Therapy [facility] for tasks assessed/performed      Past Medical History:  Diagnosis Date  . GERD (gastroesophageal reflux disease)    no current med.  . Right ACL tear 06/2016    Past Surgical History:  Procedure Laterality Date  . ARM HARDWARE REMOVAL Right 01/18/2006   humerus  . CLOSED REDUCTION AND PERCUTANEOUS PINNING OF HUMERUS FRACTURE Right 12/20/2005  . INGUINAL HERNIA REPAIR Right 08/03/2001  . KNEE ARTHROSCOPY WITH ANTERIOR CRUCIATE LIGAMENT (ACL) REPAIR Right 07/08/2016   Procedure: RIGHT KNEE ARTHROSCOPY WITH ANTERIOR CRUCIATE LIGAMENT (ACL) REPAIR with patellar tendon autograft;  Surgeon: Frederico Hamman, MD;  Location: [facility];  Service: Orthopedics;  Laterality: Right;    There were no vitals filed for this visit.      Subjective Assessment - 09/17/16 0934    Subjective "Good"   Currently in Pain? No/denies   Pain Score 0-No pain            [facility] PT Assessment - 09/17/16 0001      AROM   AROM Assessment Site Knee   Right/Left Knee Right   Right Knee Extension 5   Right Knee Flexion 115                     [facility] Adult PT Treatment/Exercise - 09/17/16 0001      Knee/Hip Exercises: Aerobic   Tread Mill 3 min 2 mph, backwards 3 min 1.   Recumbent Bike 6 min L 3 seat 8     Knee/Hip Exercises: Machines for Strengthening   Cybex Knee Extension RLE eccentrics 5lb 2x10    Cybex Knee  Flexion 20lb RLE 3x10   Cybex Leg Press 30# right leg 3x10     Knee/Hip Exercises: Plyometrics   Unilateral Jumping 2 sets;5 reps  front back, side to side    Broad Jump 2 sets;10 reps     Knee/Hip Exercises: Standing   Functional Squat 2 sets;10 reps  Tband pulling to ACL side   Wall Squat 4 sets;10 seconds                  PT Short Term Goals - 07/28/16 1737      PT SHORT TERM GOAL #1   Title independent with initial HEP   Status Achieved           PT Long Term Goals - 09/03/16 1559      PT LONG TERM GOAL #3   Title walk without assistive device all distances with minimal deviation   Status On-going     PT LONG TERM GOAL #4   Status Achieved               Plan - 09/17/16 1018    Clinical Impression Statement Progressed pt to wall squats and plyo activities, Does reports pain and weakness with wall squats. Weakness and decrease instability noted with hopping interventions.  Rehab Potential Good   PT Frequency 2x / week   PT Duration 8 weeks   PT Treatment/Interventions ADLs/Self Care Home Management;Cryotherapy;Electrical Stimulation;Gait training;Stair training;Functional mobility training;Patient/family education;Balance training;Neuromuscular re-education;Therapeutic exercise;Therapeutic activities;Visual/perceptual remediation/compensation;Manual techniques   PT Next Visit Plan try to push his strength staying within the protocol, RLE strength and stability.      Patient will benefit from skilled therapeutic intervention in order to improve the following deficits and impairments:  Abnormal gait, Decreased activity tolerance, Decreased balance, Decreased endurance, Decreased mobility, Decreased strength, Increased edema, Pain, Decreased range of motion, Difficulty walking  Visit Diagnosis: Stiffness of right knee, not elsewhere classified  Localized swelling, mass and lump, right lower limb  Acute pain of right knee  Difficulty in  walking, not elsewhere classified     Problem List Patient Active Problem List   Diagnosis Date Noted  . Chest wall pain 11/09/2014  . SOB (shortness of breath) 11/09/2014  . Left ankle pain 01/17/2014  . Well adolescent visit 10/17/2013  . Low back pain 10/17/2013  . 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 ADHD 01/23/2008    Grayce Sessionsonald G Lilibeth Opie, PTA 09/17/2016, 10:20 AM  [facility] [address] [place], [place], [address] Phone: [phone]   Fax:  [phone]  Name: Vernia BuffChandler T Budlong MRN: [id number] Date of Birth: 1995/03/18

## 2016-09-17 NOTE — Addendum Note (Signed)
Addended by: [name], Rober Skeels W on: 09/17/2016 11:31 AM   Modules accepted: Orders

## 2016-09-29 ENCOUNTER — Ambulatory Visit: Payer: 59 | Admitting: Physical Therapy

## 2016-09-29 ENCOUNTER — Encounter: Payer: Self-pay | Admitting: Physical Therapy

## 2016-09-29 DIAGNOSIS — M25561 Pain in right knee: Secondary | ICD-10-CM

## 2016-09-29 DIAGNOSIS — M25661 Stiffness of right knee, not elsewhere classified: Secondary | ICD-10-CM | POA: Diagnosis not present

## 2016-09-29 DIAGNOSIS — R262 Difficulty in walking, not elsewhere classified: Secondary | ICD-10-CM

## 2016-09-29 DIAGNOSIS — R2241 Localized swelling, mass and lump, right lower limb: Secondary | ICD-10-CM | POA: Diagnosis not present

## 2016-09-29 NOTE — Therapy (Signed)
[facility] [address] [place], [place], [address] Phone: [phone]   Fax:  [phone]  Physical Therapy Treatment  Patient Details  Name: Dylan Peterson Klooster MRN: [id number] Date of Birth: [birth year] Referring Provider: York Griceafferey  Encounter Date: 09/29/2016      PT End of Session - 09/29/16 1139    Visit Number 20   Date for PT Re-Evaluation 10/13/16   PT Start Time 1100   PT Stop Time 1150   PT Time Calculation (min) 50 min   Activity Tolerance Patient tolerated treatment well   Behavior During Therapy [facility] for tasks assessed/performed      Past Medical History:  Diagnosis Date  . GERD (gastroesophageal reflux disease)    no current med.  . Right ACL tear 06/2016    Past Surgical History:  Procedure Laterality Date  . ARM HARDWARE REMOVAL Right 01/18/2006   humerus  . CLOSED REDUCTION AND PERCUTANEOUS PINNING OF HUMERUS FRACTURE Right 12/20/2005  . INGUINAL HERNIA REPAIR Right 08/03/2001  . KNEE ARTHROSCOPY WITH ANTERIOR CRUCIATE LIGAMENT (ACL) REPAIR Right 07/08/2016   Procedure: RIGHT KNEE ARTHROSCOPY WITH ANTERIOR CRUCIATE LIGAMENT (ACL) REPAIR with patellar tendon autograft;  Surgeon: Frederico Hammanaffrey, Daniel, MD;  Location: [facility];  Service: Orthopedics;  Laterality: Right;    There were no vitals filed for this visit.      Subjective Assessment - 09/29/16 1101    Subjective Pt reports that things are good   Currently in Pain? No/denies   Pain Score 0-No pain                         [facility] Adult PT Treatment/Exercise - 09/29/16 0001      Knee/Hip Exercises: Aerobic   Elliptical 3 fwd/3back I10 R5   Recumbent Bike 5 min L 2 seat 8     Knee/Hip Exercises: Machines for Strengthening   Cybex Knee Flexion 25lb RLE 3x10     Knee/Hip Exercises: Standing   Functional Squat 2 sets;10 reps  greetn Tband TKE resistance    Wall Squat 4 sets;10 seconds   Other Standing  Knee Exercises 3 point lateral bouncing x4 40''; quick feet 6in 3x15''   Other Standing Knee Exercises Lateral bouncing in polace with rotation x30 each      Cryotherapy   Number Minutes Cryotherapy 10 Minutes   Cryotherapy Location Knee   Type of Cryotherapy Ice pack                  PT Short Term Goals - 07/28/16 1737      PT SHORT TERM GOAL #1   Title independent with initial HEP   Status Achieved           PT Long Term Goals - 09/03/16 1559      PT LONG TERM GOAL #3   Title walk without assistive device all distances with minimal deviation   Status On-going     PT LONG TERM GOAL #4   Status Achieved               Plan - 09/29/16 1139    Clinical Impression Statement Pt return to therapy after vacation, reports that he did the led press with 200 pounds using both legs while on vacation. Progressed pt to lateral bouncing, pt does reports some knee pain with lateral bouncing. Tactile cues to help prevent compensation with functional squats. All squatting intervention were < 90 deg.  Rehab Potential Good   PT Frequency 2x / week   PT Duration 8 weeks   PT Treatment/Interventions ADLs/Self Care Home Management;Cryotherapy;Electrical Stimulation;Gait training;Stair training;Functional mobility training;Patient/family education;Balance training;Neuromuscular re-education;Therapeutic exercise;Therapeutic activities;Visual/perceptual remediation/compensation;Manual techniques   PT Next Visit Plan try to push his strength staying within the protocol, RLE strength and stability.      Patient will benefit from skilled therapeutic intervention in order to improve the following deficits and impairments:  Abnormal gait, Decreased activity tolerance, Decreased balance, Decreased endurance, Decreased mobility, Decreased strength, Increased edema, Pain, Decreased range of motion, Difficulty walking  Visit Diagnosis: Stiffness of right knee, not elsewhere  classified  Localized swelling, mass and lump, right lower limb  Acute pain of right knee  Difficulty in walking, not elsewhere classified     Problem List Patient Active Problem List   Diagnosis Date Noted  . Chest wall pain 11/09/2014  . SOB (shortness of breath) 11/09/2014  . Left ankle pain 01/17/2014  . Well adolescent visit 10/17/2013  . Low back pain 10/17/2013  . 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 ADHD 01/23/2008    Grayce Sessionsonald G Pedram Goodchild, PTA 09/29/2016, 11:42 AM  [facility] [address] [place], [place], [address] Phone: [phone]   Fax:  [phone]  Name: Dylan Peterson Mcniel MRN: [id number] Date of Birth: 1995-05-24

## 2016-10-01 ENCOUNTER — Encounter: Payer: Self-pay | Admitting: Physical Therapy

## 2016-10-01 ENCOUNTER — Ambulatory Visit: Payer: 59 | Attending: Orthopedic Surgery | Admitting: Physical Therapy

## 2016-10-01 DIAGNOSIS — R262 Difficulty in walking, not elsewhere classified: Secondary | ICD-10-CM | POA: Diagnosis not present

## 2016-10-01 DIAGNOSIS — M25661 Stiffness of right knee, not elsewhere classified: Secondary | ICD-10-CM

## 2016-10-01 DIAGNOSIS — M25561 Pain in right knee: Secondary | ICD-10-CM

## 2016-10-01 DIAGNOSIS — R2241 Localized swelling, mass and lump, right lower limb: Secondary | ICD-10-CM

## 2016-10-01 NOTE — Therapy (Signed)
[facility] [address] [place], [place], [address] Phone: [phone]   Fax:  [phone]  Physical Therapy Treatment  Patient Details  Name: Dylan Peterson Waldrip MRN: [id number] Date of Birth: 1995-04-11 Referring Provider: York Griceafferey  Encounter Date: 10/01/2016      PT End of Session - 10/01/16 1142    Visit Number 21   Date for PT Re-Evaluation 10/13/16   PT Start Time 1102   PT Stop Time 1152   PT Time Calculation (min) 50 min   Activity Tolerance Patient tolerated treatment well   Behavior During Therapy [facility] for tasks assessed/performed      Past Medical History:  Diagnosis Date  . GERD (gastroesophageal reflux disease)    no current med.  . Right ACL tear 06/2016    Past Surgical History:  Procedure Laterality Date  . ARM HARDWARE REMOVAL Right 01/18/2006   humerus  . CLOSED REDUCTION AND PERCUTANEOUS PINNING OF HUMERUS FRACTURE Right 12/20/2005  . INGUINAL HERNIA REPAIR Right 08/03/2001  . KNEE ARTHROSCOPY WITH ANTERIOR CRUCIATE LIGAMENT (ACL) REPAIR Right 07/08/2016   Procedure: RIGHT KNEE ARTHROSCOPY WITH ANTERIOR CRUCIATE LIGAMENT (ACL) REPAIR with patellar tendon autograft;  Surgeon: Frederico Hammanaffrey, Daniel, MD;  Location: [facility];  Service: Orthopedics;  Laterality: Right;    There were no vitals filed for this visit.      Subjective Assessment - 10/01/16 1103    Subjective Pt reports that he is feeling good, Pt amb in clinic  without brace.   Currently in Pain? No/denies   Pain Score 0-No pain                         [facility] Adult PT Treatment/Exercise - 10/01/16 0001      Knee/Hip Exercises: Aerobic   Tread Mill 2 min 784mph,30'' 2mph, 20'' 4.755mph, 1.955min at 3mph   Recumbent Bike 4 min L3   Nustep Nustep level 4 x 4 minutes no UE     Knee/Hip Exercises: Machines for Strengthening   Cybex Knee Flexion 25lb RLE 2x15     Knee/Hip Exercises: Standing   Wall Squat  4 sets;10 seconds   Other Standing Knee Exercises 3 point lateral bouncing x4 40''; quick feet 6in 3x15''; Ali shuffel 3x15''   Other Standing Knee Exercises Lateral bouncing in place with rotation 2x30 each; Lateral shuffle on 6in box 3x15''     Cryotherapy   Number Minutes Cryotherapy 10 Minutes   Cryotherapy Location Knee   Type of Cryotherapy Ice pack                  PT Short Term Goals - 07/28/16 1737      PT SHORT TERM GOAL #1   Title independent with initial HEP   Status Achieved           PT Long Term Goals - 10/01/16 1143      PT LONG TERM GOAL #3   Title walk without assistive device all distances with minimal deviation   Status On-going     PT LONG TERM GOAL #4   Title decrease pain 50%   Status Achieved               Plan - 10/01/16 1142    Clinical Impression Statement Pt tolerated an progressing to some agility exercises well, does reports some knee pain with 4.5 mph treadmill jogging.    Rehab Potential Good   PT Frequency  2x / week   PT Duration 8 weeks   PT Treatment/Interventions ADLs/Self Care Home Management;Cryotherapy;Electrical Stimulation;Gait training;Stair training;Functional mobility training;Patient/family education;Balance training;Neuromuscular re-education;Therapeutic exercise;Therapeutic activities;Visual/perceptual remediation/compensation;Manual techniques   PT Next Visit Plan try to push his strength staying within the protocol, RLE strenght and stability.      Patient will benefit from skilled therapeutic intervention in order to improve the following deficits and impairments:  Abnormal gait, Decreased activity tolerance, Decreased balance, Decreased endurance, Decreased mobility, Decreased strength, Increased edema, Pain, Decreased range of motion, Difficulty walking  Visit Diagnosis: Stiffness of right knee, not elsewhere classified  Localized swelling, mass and lump, right lower limb  Difficulty in walking,  not elsewhere classified  Acute pain of right knee     Problem List Patient Active Problem List   Diagnosis Date Noted  . Chest wall pain 11/09/2014  . SOB (shortness of breath) 11/09/2014  . Left ankle pain 01/17/2014  . Well adolescent visit 10/17/2013  . Low back pain 10/17/2013  . 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 ADHD 01/23/2008    Grayce Sessionsonald G Demya Scruggs, PTA 10/01/2016, 11:44 AM  [facility] [address] [place], [place], [address] Phone: [phone]   Fax:  [phone]  Name: Dylan Peterson Bohl MRN: [id number] Date of Birth: [birth year]

## 2016-10-06 ENCOUNTER — Ambulatory Visit: Payer: 59 | Admitting: Physical Therapy

## 2016-10-06 ENCOUNTER — Encounter: Payer: Self-pay | Admitting: Physical Therapy

## 2016-10-06 DIAGNOSIS — M25661 Stiffness of right knee, not elsewhere classified: Secondary | ICD-10-CM

## 2016-10-06 DIAGNOSIS — R2241 Localized swelling, mass and lump, right lower limb: Secondary | ICD-10-CM | POA: Diagnosis not present

## 2016-10-06 DIAGNOSIS — M25561 Pain in right knee: Secondary | ICD-10-CM | POA: Diagnosis not present

## 2016-10-06 DIAGNOSIS — R262 Difficulty in walking, not elsewhere classified: Secondary | ICD-10-CM

## 2016-10-06 NOTE — Therapy (Signed)
[place], [place], [address] Phone: [phone]   Fax:  [phone]  Physical Therapy Treatment  Patient Details  Name: Dylan Peterson MRN: [id number] Date of Birth: 19-May-1995 Referring Provider: Shireen Quan  Encounter Date: 10/06/2016      PT End of Session - 10/06/16 1056    Visit Number 22   Date for PT Re-Evaluation 10/13/16   PT Start Time 4166   PT Stop Time 1106   PT Time Calculation (min) 51 min   Activity Tolerance Patient tolerated treatment well   Behavior During Therapy [facility] for tasks assessed/performed      Past Medical History:  Diagnosis Date  . GERD (gastroesophageal reflux disease)    no current med.  . Right ACL tear 06/2016    Past Surgical History:  Procedure Laterality Date  . ARM HARDWARE REMOVAL Right 01/18/2006   humerus  . CLOSED REDUCTION AND PERCUTANEOUS PINNING OF HUMERUS FRACTURE Right 12/20/2005  . INGUINAL HERNIA REPAIR Right 08/03/2001  . KNEE ARTHROSCOPY WITH ANTERIOR CRUCIATE LIGAMENT (ACL) REPAIR Right 07/08/2016   Procedure: RIGHT KNEE ARTHROSCOPY WITH ANTERIOR CRUCIATE LIGAMENT (ACL) REPAIR with patellar tendon autograft;  Surgeon: Earlie Server, MD;  Location: [place];  Service: Orthopedics;  Laterality: Right;    There were no vitals filed for this visit.      Subjective Assessment - 10/06/16 1018    Subjective Pt enters clinic with brace on again, pt reports that he fell yesterday doing squats.   Currently in Pain? Yes   Pain Score 4    Pain Location Knee   Pain Orientation Right            [facility] PT Assessment - 10/06/16 0001      ROM / Strength   AROM / PROM / Strength AROM;Strength     AROM   AROM Assessment Site Ankle   Right/Left Knee Right   Right Knee Extension 0   Right Knee Flexion 121     Strength   Strength Assessment Site Knee   Right/Left Knee Right   Right Knee Flexion 4/5   Right Knee  Extension 4/5  shaking                      [facility] Adult PT Treatment/Exercise - 10/06/16 0001      Knee/Hip Exercises: Aerobic   Tread Mill 2 min 59mh,30'' 267m, 20'' X3  5.mph,   Recumbent Bike 6 min L3     Knee/Hip Exercises: Machines for Strengthening   Cybex Knee Extension 5lb 3x10    Cybex Knee Flexion 35lb RLE 3x10   Cybex Leg Press 30# 2x10 plyos      Knee/Hip Exercises: Standing   Other Standing Knee Exercises  quick feet 6in 3x15''; Ali shuffel 3x15''   Other Standing Knee Exercises jump rope 3x30; SL DL 4lb x10      Cryotherapy   Number Minutes Cryotherapy 10 Minutes   Cryotherapy Location Knee   Type of Cryotherapy Ice pack                  PT Short Term Goals - 07/28/16 1737      PT SHORT TERM GOAL #1   Title independent with initial HEP   Status Achieved           PT Long Term Goals - 10/06/16 1026      PT LONG TERM GOAL #1  Title increase AROM of the right knee to 0-120 degrees flexion   Status Achieved     PT LONG TERM GOAL #2   Title demonstrate stregnth of the right knee a 4+/5   Status Partially Met     PT LONG TERM GOAL #3   Title walk without assistive device all distances with minimal deviation   Status Partially Met     PT LONG TERM GOAL #4   Title decrease pain 50%   Status Achieved               Plan - 10/06/16 1057    Clinical Impression Statement Pt has progressed meeting some LTG's, Visible R quat shaking with seated knee extensions. Pt reports falling yesterday when you was squatting. Pt reports pain with treadmill jogging and plyo on leg press.   Rehab Potential Good   PT Frequency 2x / week   PT Duration 8 weeks   PT Treatment/Interventions ADLs/Self Care Home Management;Cryotherapy;Electrical Stimulation;Gait training;Stair training;Functional mobility training;Patient/family education;Balance training;Neuromuscular re-education;Therapeutic exercise;Therapeutic activities;Visual/perceptual  remediation/compensation;Manual techniques   PT Next Visit Plan try to push his strength staying within the protocol, RLE strength and stability.      Patient will benefit from skilled therapeutic intervention in order to improve the following deficits and impairments:  Abnormal gait, Decreased activity tolerance, Decreased balance, Decreased endurance, Decreased mobility, Decreased strength, Increased edema, Pain, Decreased range of motion, Difficulty walking  Visit Diagnosis: Stiffness of right knee, not elsewhere classified  Localized swelling, mass and lump, right lower limb  Difficulty in walking, not elsewhere classified  Acute pain of right knee     Problem List Patient Active Problem List   Diagnosis Date Noted  . Chest wall pain 11/09/2014  . SOB (shortness of breath) 11/09/2014  . Left ankle pain 01/17/2014  . Well adolescent visit 10/17/2013  . Low back pain 10/17/2013  . 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 ADHD 01/23/2008    Scot Jun, PTA 10/06/2016, 10:59 AM  [place], [place], [address] Phone: [phone]   Fax:  [phone]  Name: Dylan Peterson MRN: [id number] Date of Birth: 09/10/1995

## 2016-10-08 ENCOUNTER — Encounter: Payer: Self-pay | Admitting: Physical Therapy

## 2016-10-08 ENCOUNTER — Ambulatory Visit: Payer: 59 | Admitting: Physical Therapy

## 2016-10-08 DIAGNOSIS — R262 Difficulty in walking, not elsewhere classified: Secondary | ICD-10-CM | POA: Diagnosis not present

## 2016-10-08 DIAGNOSIS — R2241 Localized swelling, mass and lump, right lower limb: Secondary | ICD-10-CM

## 2016-10-08 DIAGNOSIS — M25661 Stiffness of right knee, not elsewhere classified: Secondary | ICD-10-CM | POA: Diagnosis not present

## 2016-10-08 DIAGNOSIS — M25561 Pain in right knee: Secondary | ICD-10-CM | POA: Diagnosis not present

## 2016-10-08 NOTE — Therapy (Signed)
[place], [place], [address] Phone: [phone]   Fax:  [phone]  Physical Therapy Treatment  Patient Details  Name: Dylan Peterson MRN: [id number] Date of Birth: Feb 19, 1996 Referring Provider: Shireen Quan  Encounter Date: 10/08/2016      PT End of Session - 10/08/16 1058    Visit Number 23   Date for PT Re-Evaluation 10/13/16   PT Start Time 1012   PT Stop Time 1111   PT Time Calculation (min) 59 min   Activity Tolerance Patient tolerated treatment well   Behavior During Therapy [facility] for tasks assessed/performed      Past Medical History:  Diagnosis Date  . GERD (gastroesophageal reflux disease)    no current med.  . Right ACL tear 06/2016    Past Surgical History:  Procedure Laterality Date  . ARM HARDWARE REMOVAL Right 01/18/2006   humerus  . CLOSED REDUCTION AND PERCUTANEOUS PINNING OF HUMERUS FRACTURE Right 12/20/2005  . INGUINAL HERNIA REPAIR Right 08/03/2001  . KNEE ARTHROSCOPY WITH ANTERIOR CRUCIATE LIGAMENT (ACL) REPAIR Right 07/08/2016   Procedure: RIGHT KNEE ARTHROSCOPY WITH ANTERIOR CRUCIATE LIGAMENT (ACL) REPAIR with patellar tendon autograft;  Surgeon: Earlie Server, MD;  Location: [place];  Service: Orthopedics;  Laterality: Right;    There were no vitals filed for this visit.      Subjective Assessment - 10/08/16 1010    Subjective Good, no pain just sore   Currently in Pain? No/denies   Pain Score 0-No pain                         [facility] Adult PT Treatment/Exercise - 10/08/16 0001      Knee/Hip Exercises: Aerobic   Tread Mill 2 min 82mh,30'' 5 mph' X3     Recumbent Bike 6 min L3     Knee/Hip Exercises: Machines for Strengthening   Cybex Knee Flexion 35lb RLE 3x10   Cybex Leg Press 20# 2x10 plyos      Knee/Hip Exercises: Standing   Wall Squat 4 sets;10 seconds   Other Standing Knee Exercises  quick feet 6in 3x15'';  Ali shuffel 3x15''   Other Standing Knee Exercises jump rope 3x30; skaters 2x10      Knee/Hip Exercises: Seated   Long Arc Quad Right;10 reps;3 sets   LIllinois Tool WorksWeight 3 lbs.                  PT Short Term Goals - 07/28/16 1737      PT SHORT TERM GOAL #1   Title independent with initial HEP   Status Achieved           PT Long Term Goals - 10/06/16 1026      PT LONG TERM GOAL #1   Title increase AROM of the right knee to 0-120 degrees flexion   Status Achieved     PT LONG TERM GOAL #2   Title demonstrate stregnth of the right knee a 4+/5   Status Partially Met     PT LONG TERM GOAL #3   Title walk without assistive device all distances with minimal deviation   Status Partially Met     PT LONG TERM GOAL #4   Title decrease pain 50%   Status Achieved               Plan - 10/08/16 1058    Clinical Impression Statement Pt RLE remains  weak, visible shaking with LAQ, wall sits, and skaters. Reports some pain with plyo on leg press. Pt with very weak initiation with leg press plyo. Reports a little discomfort with treadmill jogging.   Rehab Potential Good   PT Frequency 2x / week   PT Duration 8 weeks   PT Treatment/Interventions ADLs/Self Care Home Management;Cryotherapy;Electrical Stimulation;Gait training;Stair training;Functional mobility training;Patient/family education;Balance training;Neuromuscular re-education;Therapeutic exercise;Therapeutic activities;Visual/perceptual remediation/compensation;Manual techniques   PT Next Visit Plan try to push his strength staying within the protocol, RLE strenght and stability.      Patient will benefit from skilled therapeutic intervention in order to improve the following deficits and impairments:  Abnormal gait, Decreased activity tolerance, Decreased balance, Decreased endurance, Decreased mobility, Decreased strength, Increased edema, Pain, Decreased range of motion, Difficulty walking  Visit  Diagnosis: Stiffness of right knee, not elsewhere classified  Localized swelling, mass and lump, right lower limb  Difficulty in walking, not elsewhere classified  Acute pain of right knee     Problem List Patient Active Problem List   Diagnosis Date Noted  . Chest wall pain 11/09/2014  . SOB (shortness of breath) 11/09/2014  . Left ankle pain 01/17/2014  . Well adolescent visit 10/17/2013  . Low back pain 10/17/2013  . 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 ADHD 01/23/2008    Scot Jun, PTA 10/08/2016, 11:00 AM  [place], [place], [address] Phone: [phone]   Fax:  [phone]  Name: Dylan Peterson MRN: [id number] Date of Birth: 10/23/1995

## 2016-10-13 ENCOUNTER — Ambulatory Visit: Payer: 59 | Admitting: Physical Therapy

## 2016-10-13 ENCOUNTER — Encounter: Payer: Self-pay | Admitting: Physical Therapy

## 2016-10-13 DIAGNOSIS — M25561 Pain in right knee: Secondary | ICD-10-CM

## 2016-10-13 DIAGNOSIS — R262 Difficulty in walking, not elsewhere classified: Secondary | ICD-10-CM | POA: Diagnosis not present

## 2016-10-13 DIAGNOSIS — S83512D Sprain of anterior cruciate ligament of left knee, subsequent encounter: Secondary | ICD-10-CM | POA: Diagnosis not present

## 2016-10-13 DIAGNOSIS — R2241 Localized swelling, mass and lump, right lower limb: Secondary | ICD-10-CM

## 2016-10-13 DIAGNOSIS — M25661 Stiffness of right knee, not elsewhere classified: Secondary | ICD-10-CM

## 2016-10-13 NOTE — Therapy (Signed)
[place], [place], [address] Phone: [phone]   Fax:  [phone]  Physical Therapy Treatment  Patient Details  Name: Dylan Peterson MRN: [id number] Date of Birth: 06-13-1995 Referring Provider: Shireen Quan  Encounter Date: 10/13/2016      PT End of Session - 10/13/16 1150    Visit Number 24   Date for PT Re-Evaluation 10/13/16   PT Start Time 1100   PT Stop Time 1158   PT Time Calculation (min) 58 min   Activity Tolerance Patient tolerated treatment well   Behavior During Therapy [facility] for tasks assessed/performed      Past Medical History:  Diagnosis Date  . GERD (gastroesophageal reflux disease)    no current med.  . Right ACL tear 06/2016    Past Surgical History:  Procedure Laterality Date  . ARM HARDWARE REMOVAL Right 01/18/2006   humerus  . CLOSED REDUCTION AND PERCUTANEOUS PINNING OF HUMERUS FRACTURE Right 12/20/2005  . INGUINAL HERNIA REPAIR Right 08/03/2001  . KNEE ARTHROSCOPY WITH ANTERIOR CRUCIATE LIGAMENT (ACL) REPAIR Right 07/08/2016   Procedure: RIGHT KNEE ARTHROSCOPY WITH ANTERIOR CRUCIATE LIGAMENT (ACL) REPAIR with patellar tendon autograft;  Surgeon: Earlie Server, MD;  Location: [facility];  Service: Orthopedics;  Laterality: Right;    There were no vitals filed for this visit.      Subjective Assessment - 10/13/16 1104    Subjective "Good"   Currently in Pain? No/denies   Pain Score 0-No pain                         [facility] Adult PT Treatment/Exercise - 10/13/16 0001      Ambulation/Gait   Gait Comments light jogging in the hallyay, walking lunges, side shuffls, frong jumps     Knee/Hip Exercises: Aerobic   Tread Mill 2 min 61mh,30'' 5 mph' X3     Recumbent Bike 6 min L3     Knee/Hip Exercises: Machines for Strengthening   Cybex Leg Press 20# 2x10 plyos      Knee/Hip Exercises: Standing   Other Standing Knee  Exercises jump rope 4x50; skaters 2x10      Knee/Hip Exercises: Seated   Long Arc Quad Right;10 reps;3 sets   LIllinois Tool WorksWeight 3 lbs.                  PT Short Term Goals - 07/28/16 1737      PT SHORT TERM GOAL #1   Title independent with initial HEP   Status Achieved           PT Long Term Goals - 10/06/16 1026      PT LONG TERM GOAL #1   Title increase AROM of the right knee to 0-120 degrees flexion   Status Achieved     PT LONG TERM GOAL #2   Title demonstrate stregnth of the right knee a 4+/5   Status Partially Met     PT LONG TERM GOAL #3   Title walk without assistive device all distances with minimal deviation   Status Partially Met     PT LONG TERM GOAL #4   Title decrease pain 50%   Status Achieved               Plan - 10/13/16 1151    Clinical Impression Statement progressed to some hallway jogging, pt lands hard on LLE that could indicate eccentric load weakness  of RLE. Visible shaking with RLE during LAQ. Pt does compensate with jumping activities. Cues to push off RLE with side shuffles.   PT Frequency 2x / week   PT Duration 8 weeks   PT Next Visit Plan try to push his strength staying within the protocol, RLE strenght and stability.      Patient will benefit from skilled therapeutic intervention in order to improve the following deficits and impairments:  Abnormal gait, Decreased activity tolerance, Decreased balance, Decreased endurance, Decreased mobility, Decreased strength, Increased edema, Pain, Decreased range of motion, Difficulty walking  Visit Diagnosis: Stiffness of right knee, not elsewhere classified  Localized swelling, mass and lump, right lower limb  Difficulty in walking, not elsewhere classified  Acute pain of right knee     Problem List Patient Active Problem List   Diagnosis Date Noted  . Chest wall pain 11/09/2014  . SOB (shortness of breath) 11/09/2014  . Left ankle pain 01/17/2014  . Well  adolescent visit 10/17/2013  . Low back pain 10/17/2013  . 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 ADHD 01/23/2008    Scot Jun, PTA 10/13/2016, 11:52 AM  [place], [place], [address] Phone: [phone]   Fax:  [phone]  Name: Dylan Peterson MRN: [id number] Date of Birth: 06-Aug-1995

## 2016-10-15 ENCOUNTER — Ambulatory Visit: Payer: 59 | Admitting: Physical Therapy

## 2016-10-16 ENCOUNTER — Ambulatory Visit: Payer: 59 | Admitting: Physical Therapy

## 2016-10-20 ENCOUNTER — Encounter: Payer: Self-pay | Admitting: Physical Therapy

## 2016-10-20 ENCOUNTER — Ambulatory Visit: Payer: 59 | Admitting: Physical Therapy

## 2016-10-20 DIAGNOSIS — M25561 Pain in right knee: Secondary | ICD-10-CM | POA: Diagnosis not present

## 2016-10-20 DIAGNOSIS — R2241 Localized swelling, mass and lump, right lower limb: Secondary | ICD-10-CM | POA: Diagnosis not present

## 2016-10-20 DIAGNOSIS — R262 Difficulty in walking, not elsewhere classified: Secondary | ICD-10-CM | POA: Diagnosis not present

## 2016-10-20 DIAGNOSIS — M25661 Stiffness of right knee, not elsewhere classified: Secondary | ICD-10-CM | POA: Diagnosis not present

## 2016-10-20 NOTE — Therapy (Signed)
[place], [place], [address] Phone: [phone]   Fax:  [phone]  Physical Therapy Treatment  Patient Details  Name: Dylan Peterson MRN: [id number] Date of Birth: January 27, 1996 Referring Provider: Shireen Quan  Encounter Date: 10/20/2016      PT End of Session - 10/20/16 1136    Visit Number 25   Date for PT Re-Evaluation 10/13/16   PT Start Time 1100   PT Stop Time 1145   PT Time Calculation (min) 45 min      Past Medical History:  Diagnosis Date  . GERD (gastroesophageal reflux disease)    no current med.  . Right ACL tear 06/2016    Past Surgical History:  Procedure Laterality Date  . ARM HARDWARE REMOVAL Right 01/18/2006   humerus  . CLOSED REDUCTION AND PERCUTANEOUS PINNING OF HUMERUS FRACTURE Right 12/20/2005  . INGUINAL HERNIA REPAIR Right 08/03/2001  . KNEE ARTHROSCOPY WITH ANTERIOR CRUCIATE LIGAMENT (ACL) REPAIR Right 07/08/2016   Procedure: RIGHT KNEE ARTHROSCOPY WITH ANTERIOR CRUCIATE LIGAMENT (ACL) REPAIR with patellar tendon autograft;  Surgeon: Earlie Server, MD;  Location: [facility];  Service: Orthopedics;  Laterality: Right;    There were no vitals filed for this visit.      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 No/denies                         [facility] Adult PT Treatment/Exercise - 10/20/16 0001      High Level Balance   High Level Balance Comments RSLS on BOSU blue side up, DL blue side down  DL squats adn ball toss     Knee/Hip Exercises: Aerobic   Elliptical 3 fwd/ 3 back I 10 R 5   Recumbent Bike 6 min L3  end of session for endurance     Knee/Hip Exercises: Machines for Strengthening   Cybex Leg Press 20# 2x20 plyos    Other Machine running man with pulley resistance     Knee/Hip Exercises: Plyometrics   Bilateral Jumping --  with directional changes   Unilateral Jumping --  speed skaters 30 sec 2 times     Knee/Hip Exercises: Standing   Lunge Walking - Round Trips 30 2 times     Knee/Hip Exercises: Seated   Sit to Sand 10 reps;without UE support  SL RT from UBE seat, unable to do mat height                  PT Short Term Goals - 07/28/16 1737      PT SHORT TERM GOAL #1   Title independent with initial HEP   Status Achieved           PT Long Term Goals - 10/20/16 1138      PT LONG TERM GOAL #1   Title increase AROM of the right knee to 0-120 degrees flexion   Status Achieved     PT LONG TERM GOAL #2   Title demonstrate stregnth of the right knee a 4+/5   Status Partially Met     PT LONG TERM GOAL #3   Title walk without assistive device all distances with minimal deviation   Status Partially Met     PT LONG TERM GOAL #4   Title decrease pain 50%   Status Achieved  Plan - 10/20/16 1136    Clinical Impression Statement weakness visably noted with activity in quad, improved activity tolerance but does fatigue. Difficulty with SLS activity and lateral mvmt. cuing for equal wt with jumping.      Patient will benefit from skilled therapeutic intervention in order to improve the following deficits and impairments:     Visit Diagnosis: Stiffness of right knee, not elsewhere classified  Difficulty in walking, not elsewhere classified  Acute pain of right knee     Problem List Patient Active Problem List   Diagnosis Date Noted  . Chest wall pain 11/09/2014  . SOB (shortness of breath) 11/09/2014  . Left ankle pain 01/17/2014  . Well adolescent visit 10/17/2013  . Low back pain 10/17/2013  . 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 ADHD 01/23/2008    Gifford Ballon,ANGIE PTA 10/20/2016, 11:39 AM  [place], [place], [address] Phone: [phone]   Fax:   [phone]  Name: Dylan Peterson MRN: [id number] Date of Birth: Dec 19, 1995

## 2016-10-22 ENCOUNTER — Ambulatory Visit: Payer: 59 | Admitting: Physical Therapy

## 2016-10-22 ENCOUNTER — Encounter: Payer: Self-pay | Admitting: Physical Therapy

## 2016-10-22 DIAGNOSIS — M25661 Stiffness of right knee, not elsewhere classified: Secondary | ICD-10-CM

## 2016-10-22 DIAGNOSIS — R2241 Localized swelling, mass and lump, right lower limb: Secondary | ICD-10-CM

## 2016-10-22 DIAGNOSIS — M25561 Pain in right knee: Secondary | ICD-10-CM

## 2016-10-22 DIAGNOSIS — R262 Difficulty in walking, not elsewhere classified: Secondary | ICD-10-CM

## 2016-10-22 NOTE — Therapy (Signed)
[facility] [place] [address] [place], [place], [address] Phone: [phone]   Fax:  [phone]  Physical Therapy Treatment  Patient Details  Name: Dylan Peterson MRN: [id number] Date of Birth: 06-Mar-1995 Referring Provider: York Grice  Encounter Date: 10/22/2016      PT End of Session - 10/22/16 1224    Visit Number 16   Date for PT Re-Evaluation 11/14/16   PT Start Time 1145   PT Stop Time 1240   PT Time Calculation (min) 55 min   Activity Tolerance Patient tolerated treatment well   Behavior During Therapy [facility] for tasks assessed/performed      Past Medical History:  Diagnosis Date  . GERD (gastroesophageal reflux disease)    no current med.  . Right ACL tear 06/2016    Past Surgical History:  Procedure Laterality Date  . ARM HARDWARE REMOVAL Right 01/18/2006   humerus  . CLOSED REDUCTION AND PERCUTANEOUS PINNING OF HUMERUS FRACTURE Right 12/20/2005  . INGUINAL HERNIA REPAIR Right 08/03/2001  . KNEE ARTHROSCOPY WITH ANTERIOR CRUCIATE LIGAMENT (ACL) REPAIR Right 07/08/2016   Procedure: RIGHT KNEE ARTHROSCOPY WITH ANTERIOR CRUCIATE LIGAMENT (ACL) REPAIR with patellar tendon autograft;  Surgeon: Frederico Hamman, MD;  Location: [facility];  Service: Orthopedics;  Laterality: Right;    There were no vitals filed for this visit.      Subjective Assessment - 10/22/16 1144    Subjective "Good, just soreness form work"   Currently in Pain? No/denies   Pain Score 0-No pain                         [facility] Adult PT Treatment/Exercise - 10/22/16 0001      Knee/Hip Exercises: Aerobic   Elliptical 3 fwd/ 3 back I 15 R 5     Knee/Hip Exercises: Machines for Strengthening   Cybex Knee Extension 5lb 2x10    Cybex Knee Flexion 35lb RLE 3x10   Cybex Leg Press 20# 2x20 plyos      Knee/Hip Exercises: Plyometrics   Broad Jump 1 set;5 sets  25lb     Knee/Hip Exercises: Standing   Other Standing Knee Exercises Medial resistance on RLE with step overs 2x10; SL DL with LE band reistance 7lb 2x5 each   Other Standing Knee Exercises Running place with 35lb 3x10, 20lb lateral skaters x10 each     Knee/Hip Exercises: Supine   Other Supine Knee/Hip Exercises LE on chair, back on mat table and march 2x10      Modalities   Modalities Vasopneumatic     Vasopneumatic   Number Minutes Vasopneumatic  15 minutes   Vasopnuematic Location  Knee   Vasopneumatic Pressure Medium   Vasopneumatic Temperature  40                  PT Short Term Goals - 07/28/16 1737      PT SHORT TERM GOAL #1   Title independent with initial HEP   Status Achieved           PT Long Term Goals - 10/22/16 1227      PT LONG TERM GOAL #3   Title walk without assistive device all distances with minimal deviation   Status Partially Met               Plan - 10/22/16 1225    Clinical Impression Statement Pt able to complete all of today's activities, difficulty with SL activities  remains. Visible R quad weakness noted with lateral jumping interventions. Does report some pain with LAQ. visible R quad shaking with LAQ.   Rehab Potential Good   PT Frequency 2x / week   PT Duration 8 weeks   PT Treatment/Interventions ADLs/Self Care Home Management;Cryotherapy;Electrical Stimulation;Gait training;Stair training;Functional mobility training;Patient/family education;Balance training;Neuromuscular re-education;Therapeutic exercise;Therapeutic activities;Visual/perceptual remediation/compensation;Manual techniques   PT Next Visit Plan try to push his strength staying within the protocol, RLE strenght and stability.      Patient will benefit from skilled therapeutic intervention in order to improve the following deficits and impairments:  Abnormal gait, Decreased activity tolerance, Decreased balance, Decreased endurance, Decreased mobility, Decreased strength, Increased edema, Pain,  Decreased range of motion, Difficulty walking  Visit Diagnosis: Stiffness of right knee, not elsewhere classified  Difficulty in walking, not elsewhere classified  Acute pain of right knee  Localized swelling, mass and lump, right lower limb     Problem List Patient Active Problem List   Diagnosis Date Noted  . Chest wall pain 11/09/2014  . SOB (shortness of breath) 11/09/2014  . Left ankle pain 01/17/2014  . Well adolescent visit 10/17/2013  . Low back pain 10/17/2013  . 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 ADHD 01/23/2008    Dylan Peterson, PTA 10/22/2016, 12:28 PM  [place], [place], [address] Phone: [phone]   Fax:  [phone]  Name: Dylan Peterson MRN: [id number] Date of Birth: 12-30-1995

## 2016-11-04 ENCOUNTER — Encounter: Payer: 59 | Admitting: Physical Therapy

## 2016-11-05 ENCOUNTER — Ambulatory Visit: Payer: 59 | Attending: Orthopedic Surgery | Admitting: Physical Therapy

## 2016-11-05 ENCOUNTER — Encounter: Payer: Self-pay | Admitting: Physical Therapy

## 2016-11-05 DIAGNOSIS — M25561 Pain in right knee: Secondary | ICD-10-CM | POA: Insufficient documentation

## 2016-11-05 DIAGNOSIS — M25661 Stiffness of right knee, not elsewhere classified: Secondary | ICD-10-CM | POA: Insufficient documentation

## 2016-11-05 DIAGNOSIS — R2241 Localized swelling, mass and lump, right lower limb: Secondary | ICD-10-CM | POA: Diagnosis not present

## 2016-11-05 DIAGNOSIS — R262 Difficulty in walking, not elsewhere classified: Secondary | ICD-10-CM | POA: Insufficient documentation

## 2016-11-05 NOTE — Therapy (Signed)
[place], [place], [address] Phone: [phone]   Fax:  [phone]  Physical Therapy Treatment  Patient Details  Name: Dylan Peterson MRN: [id number] Date of Birth: December 04, 1995 Referring Provider: Shireen Quan  Encounter Date: 11/05/2016      PT End of Session - 11/05/16 0924    Visit Number 27   Date for PT Re-Evaluation 11/14/16   PT Start Time 0845   PT Stop Time 0934   PT Time Calculation (min) 49 min   Activity Tolerance Patient tolerated treatment well   Behavior During Therapy [facility] for tasks assessed/performed      Past Medical History:  Diagnosis Date  . GERD (gastroesophageal reflux disease)    no current med.  . Right ACL tear 06/2016    Past Surgical History:  Procedure Laterality Date  . ARM HARDWARE REMOVAL Right 01/18/2006   humerus  . CLOSED REDUCTION AND PERCUTANEOUS PINNING OF HUMERUS FRACTURE Right 12/20/2005  . INGUINAL HERNIA REPAIR Right 08/03/2001  . KNEE ARTHROSCOPY WITH ANTERIOR CRUCIATE LIGAMENT (ACL) REPAIR Right 07/08/2016   Procedure: RIGHT KNEE ARTHROSCOPY WITH ANTERIOR CRUCIATE LIGAMENT (ACL) REPAIR with patellar tendon autograft;  Surgeon: Earlie Server, MD;  Location: [facility];  Service: Orthopedics;  Laterality: Right;    There were no vitals filed for this visit.      Subjective Assessment - 11/05/16 0846    Subjective Pt reports things are going well, he does report that he has good and bad days, on the bad days his knee is sore from work   Currently in Pain? No/denies   Pain Score 0-No pain                         [facility] Adult PT Treatment/Exercise - 11/05/16 0001      Ambulation/Gait   Gait Comments light jogging, walkng lunges, back peddel and sprints, carioca      Knee/Hip Exercises: Aerobic   Elliptical 3 fwd/ 3 back I 15 R 5   Other Aerobic tread mill running up to 51mh x3 15''   reports knee pian  below patella     Knee/Hip Exercises: Machines for Strengthening   Cybex Knee Extension 5lb 2x10    Cybex Knee Flexion 35lb RLE 3x10   Cybex Leg Press 20lb RLE 2x15     Knee/Hip Exercises: Plyometrics   Unilateral Jumping 1 set;10 reps   Broad Jump 1 set;10 reps     Knee/Hip Exercises: Standing   Forward Step Up Right;1 set;10 reps;Hand Hold: 0  on mat table   Other Standing Knee Exercises SL Dead lifts 7lb 2x10                  PT Short Term Goals - 07/28/16 1737      PT SHORT TERM GOAL #1   Title independent with initial HEP   Status Achieved           PT Long Term Goals - 11/05/16 0929      PT LONG TERM GOAL #3   Title walk without assistive device all distances with minimal deviation   Status Partially Met               Plan - 11/05/16 0926    Clinical Impression Statement Pt returns to therapy after two weeks, reports that he has been working a lot. Pt fatigues with today's activities. He repots pain below the  patella with most interventions. Visible shaking with seated knee extensions. Pt tends to lean away from RLE with jumping and landing. Very little knee bend and distance with single leg jumping with RLE.    Rehab Potential Good   PT Frequency 2x / week   PT Duration 8 weeks   PT Treatment/Interventions ADLs/Self Care Home Management;Cryotherapy;Electrical Stimulation;Gait training;Stair training;Functional mobility training;Patient/family education;Balance training;Neuromuscular re-education;Therapeutic exercise;Therapeutic activities;Visual/perceptual remediation/compensation;Manual techniques   PT Next Visit Plan try to push his strength staying within the protocol, RLE strenght and stability. R quad is very weak.      Patient will benefit from skilled therapeutic intervention in order to improve the following deficits and impairments:  Abnormal gait, Decreased activity tolerance, Decreased balance, Decreased endurance, Decreased mobility,  Decreased strength, Increased edema, Pain, Decreased range of motion, Difficulty walking  Visit Diagnosis: Stiffness of right knee, not elsewhere classified  Difficulty in walking, not elsewhere classified  Acute pain of right knee  Localized swelling, mass and lump, right lower limb     Problem List Patient Active Problem List   Diagnosis Date Noted  . Chest wall pain 11/09/2014  . SOB (shortness of breath) 11/09/2014  . Left ankle pain 01/17/2014  . Well adolescent visit 10/17/2013  . Low back pain 10/17/2013  . 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 ADHD 01/23/2008    Scot Jun, PTA 11/05/2016, 9:29 AM  [place], [place], [address] Phone: [phone]   Fax:  [phone]  Name: Dylan Peterson MRN: [id number] Date of Birth: 05/15/1995

## 2016-11-12 ENCOUNTER — Encounter: Payer: Self-pay | Admitting: Physical Therapy

## 2016-11-12 ENCOUNTER — Ambulatory Visit: Payer: 59 | Admitting: Physical Therapy

## 2016-11-12 DIAGNOSIS — R262 Difficulty in walking, not elsewhere classified: Secondary | ICD-10-CM

## 2016-11-12 DIAGNOSIS — M25661 Stiffness of right knee, not elsewhere classified: Secondary | ICD-10-CM

## 2016-11-12 DIAGNOSIS — M25561 Pain in right knee: Secondary | ICD-10-CM | POA: Diagnosis not present

## 2016-11-12 DIAGNOSIS — R2241 Localized swelling, mass and lump, right lower limb: Secondary | ICD-10-CM | POA: Diagnosis not present

## 2016-11-12 NOTE — Therapy (Signed)
[place], [place], [address] Phone: [phone]   Fax:  [phone]  Physical Therapy Treatment  Patient Details  Name: Dylan Peterson MRN: [id number] Date of Birth: Jul 30, 1995 Referring Provider: Shireen Quan  Encounter Date: 11/12/2016      PT End of Session - 11/12/16 0842    Visit Number 28   Date for PT Re-Evaluation 11/14/16   PT Start Time 0807   PT Stop Time 0845   PT Time Calculation (min) 38 min      Past Medical History:  Diagnosis Date  . GERD (gastroesophageal reflux disease)    no current med.  . Right ACL tear 06/2016    Past Surgical History:  Procedure Laterality Date  . ARM HARDWARE REMOVAL Right 01/18/2006   humerus  . CLOSED REDUCTION AND PERCUTANEOUS PINNING OF HUMERUS FRACTURE Right 12/20/2005  . INGUINAL HERNIA REPAIR Right 08/03/2001  . KNEE ARTHROSCOPY WITH ANTERIOR CRUCIATE LIGAMENT (ACL) REPAIR Right 07/08/2016   Procedure: RIGHT KNEE ARTHROSCOPY WITH ANTERIOR CRUCIATE LIGAMENT (ACL) REPAIR with patellar tendon autograft;  Surgeon: Earlie Server, MD;  Location: [facility];  Service: Orthopedics;  Laterality: Right;    There were no vitals filed for this visit.      Subjective Assessment - 11/12/16 0810    Subjective lifting alot at work so very sore   Currently in Pain? Yes   Pain Score 3    Pain Location Knee   Pain Orientation Right            [facility] PT Assessment - 11/12/16 0001      Strength   Right Knee Flexion 5/5   Right Knee Extension 4/5                     [facility] Adult PT Treatment/Exercise - 11/12/16 0001      Knee/Hip Exercises: Aerobic   Elliptical 3 fwd/ 3 back I 15 R 5   Other Aerobic TM side stepping 2 min each way     Knee/Hip Exercises: Machines for Strengthening   Cybex Knee Extension 5lb 2x10   RT LE only   Cybex Knee Flexion 35lb RLE 3x10   Cybex Leg Press 20lb RLE 2x15     Knee/Hip  Exercises: Plyometrics   Unilateral Jumping Limitations fwd and SW on trampoline     Knee/Hip Exercises: Standing   Other Standing Knee Exercises SL Dead lifts 7lb 2x15  on airex     Knee/Hip Exercises: Seated   Sit to Sand 15 reps;without UE support  Single Leg                  PT Short Term Goals - 07/28/16 1737      PT SHORT TERM GOAL #1   Title independent with initial HEP   Status Achieved           PT Long Term Goals - 11/12/16 2119      PT LONG TERM GOAL #2   Title demonstrate stregnth of the right knee a 4+/5   Baseline quad weakness and quick to fatigue     PT LONG TERM GOAL #3   Title walk without assistive device all distances with minimal deviation   Baseline decreases with pain and fatigue   Status Partially Met               Plan - 11/12/16 0843    Clinical Impression Statement quad weakness  still an issue but progressing with all goals. will continue to work on [place] nd return to all PLOF activities   PT Treatment/Interventions ADLs/Self Care Home Management;Cryotherapy;Electrical Stimulation;Gait training;Stair training;Functional mobility training;Patient/family education;Balance training;Neuromuscular re-education;Therapeutic exercise;Therapeutic activities;Visual/perceptual remediation/compensation;Manual techniques   PT Next Visit Plan try to push his strength staying within the protocol, RLE strenght and stability. R quad is very weak.      Patient will benefit from skilled therapeutic intervention in order to improve the following deficits and impairments:  Abnormal gait, Decreased activity tolerance, Decreased balance, Decreased endurance, Decreased mobility, Decreased strength, Increased edema, Pain, Decreased range of motion, Difficulty walking  Visit Diagnosis: Stiffness of right knee, not elsewhere classified  Difficulty in walking, not elsewhere classified     Problem List Patient Active Problem List    Diagnosis Date Noted  . Chest wall pain 11/09/2014  . SOB (shortness of breath) 11/09/2014  . Left ankle pain 01/17/2014  . Well adolescent visit 10/17/2013  . Low back pain 10/17/2013  . 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 ADHD 01/23/2008    Clio Gerhart,ANGIE PTA 11/12/2016, 8:45 AM  [place], [place], [address] Phone: [phone]   Fax:  [phone]  Name: JORDON KRISTIANSEN MRN: [id number] Date of Birth: Apr 23, 1995

## 2016-11-13 ENCOUNTER — Ambulatory Visit: Payer: 59 | Admitting: Physical Therapy

## 2016-11-15 DIAGNOSIS — J069 Acute upper respiratory infection, unspecified: Secondary | ICD-10-CM | POA: Diagnosis not present

## 2016-12-03 DIAGNOSIS — S83512D Sprain of anterior cruciate ligament of left knee, subsequent encounter: Secondary | ICD-10-CM | POA: Diagnosis not present

## 2016-12-07 DIAGNOSIS — Z23 Encounter for immunization: Secondary | ICD-10-CM | POA: Diagnosis not present

## 2016-12-07 DIAGNOSIS — Z202 Contact with and (suspected) exposure to infections with a predominantly sexual mode of transmission: Secondary | ICD-10-CM | POA: Diagnosis not present

## 2016-12-13 DIAGNOSIS — S3022XA Contusion of scrotum and testes, initial encounter: Secondary | ICD-10-CM | POA: Diagnosis not present

## 2017-10-19 DIAGNOSIS — S46011A Strain of muscle(s) and tendon(s) of the rotator cuff of right shoulder, initial encounter: Secondary | ICD-10-CM | POA: Diagnosis not present

## 2018-01-02 DIAGNOSIS — B354 Tinea corporis: Secondary | ICD-10-CM | POA: Diagnosis not present

## 2018-01-02 DIAGNOSIS — L500 Allergic urticaria: Secondary | ICD-10-CM | POA: Diagnosis not present

## 2018-01-02 DIAGNOSIS — L309 Dermatitis, unspecified: Secondary | ICD-10-CM | POA: Diagnosis not present

## 2018-01-02 DIAGNOSIS — L0101 Non-bullous impetigo: Secondary | ICD-10-CM | POA: Diagnosis not present

## 2018-04-14 DIAGNOSIS — R1110 Vomiting, unspecified: Secondary | ICD-10-CM | POA: Diagnosis not present

## 2018-04-14 DIAGNOSIS — R197 Diarrhea, unspecified: Secondary | ICD-10-CM | POA: Diagnosis not present

## 2019-02-03 ENCOUNTER — Telehealth: Payer: Self-pay

## 2019-02-03 NOTE — Telephone Encounter (Signed)
Pt notified imm record ready for pick up. I did advise him if he needs any vaccines he needs to re-establish care with [place] since it's been over 4 yrs. Pt verbalized understanding and copy of imm placed at the front for pick up

## 2019-02-03 NOTE — Telephone Encounter (Signed)
Patient was last seen by Dr. Glori Bickers in 2016.  Patient called and stated that he is needing a copy of his immunization record from [facility] for a new job. Patient states he can come pick this up when it is ready.   Thanks

## 2020-04-20 DIAGNOSIS — Z20822 Contact with and (suspected) exposure to covid-19: Secondary | ICD-10-CM | POA: Diagnosis not present

## 2021-08-10 ENCOUNTER — Ambulatory Visit
Admission: EM | Admit: 2021-08-10 | Discharge: 2021-08-10 | Disposition: A | Discharge status: Home / Self Care | Payer: 59 | Attending: Family Medicine | Admitting: Family Medicine

## 2021-08-10 ENCOUNTER — Ambulatory Visit (INDEPENDENT_AMBULATORY_CARE_PROVIDER_SITE_OTHER): Payer: 59

## 2021-08-10 DIAGNOSIS — R109 Unspecified abdominal pain: Secondary | ICD-10-CM | POA: Diagnosis not present

## 2021-08-10 LAB — URINALYSIS, ROUTINE W REFLEX MICROSCOPIC
Glucose, UA: NEGATIVE mg/dL
Leukocytes,Ua: NEGATIVE
Nitrite: NEGATIVE
Protein, ur: 30 mg/dL — AB
Specific Gravity, Urine: >1.03 — ABNORMAL HIGH (ref 1.005–1.030)
pH: 5.5 (ref 5.0–8.0)

## 2021-08-10 LAB — URINALYSIS, MICROSCOPIC (REFLEX)
RBC / HPF: >50 RBC/hpf (ref 0–5)
Squamous Epithelial / HPF: NONE SEEN (ref 0–5)

## 2021-08-10 MED ORDER — TAMSULOSIN HCL 0.4 MG PO CAPS
0.4000 mg | ORAL_CAPSULE | Freq: Every day | ORAL | 0 refills | Status: AC
Start: 2021-08-10 — End: ?

## 2021-08-10 MED ORDER — HYDROCODONE-ACETAMINOPHEN 5-325 MG PO TABS: 1.0000 | ORAL_TABLET | Freq: Four times a day (QID) | ORAL | 0 refills | Status: AC | PRN

## 2021-08-10 MED ORDER — KETOROLAC TROMETHAMINE 60 MG/2ML IM SOLN
60.0000 mg | Freq: Once | INTRAMUSCULAR | Status: AC
  Administered 2021-08-10: 60 mg via INTRAMUSCULAR

## 2021-08-10 NOTE — ED Provider Notes (Signed)
[facility]    CSN: [id number] Arrival date & time: 08/10/21  1145      History   Chief Complaint Chief Complaint  Patient presents with   Back Pain   Kidney Stones   HPI 26 year old male presents in acute distress secondary to pain.  Patient is accompanied by his significant other.  He is experiencing severe right-sided flank pain.  No reports of hematuria.  No prior history of kidney stone.  He states that his symptoms started abruptly this morning.  He is having difficulty getting comfortable.  No relieving factors.  Some nausea.  Some lower abdominal pain as well.  No other associated symptoms.  Pain is currently severe, 9 out of 10.  Past Medical History:  Diagnosis Date   GERD (gastroesophageal reflux disease)    no current med.   Right ACL tear 06/2016    Patient Active Problem List   Diagnosis Date Noted   Chest wall pain 11/09/2014   SOB (shortness of breath) 11/09/2014   Left ankle pain 01/17/2014   Well adolescent visit 10/17/2013   Low back pain 10/17/2013   Inflammatory acne 10/17/2013   ADHD 01/23/2008    Past Surgical History:  Procedure Laterality Date   ARM HARDWARE REMOVAL Right 01/18/2006   humerus   CLOSED REDUCTION AND PERCUTANEOUS PINNING OF HUMERUS FRACTURE Right 12/20/2005   INGUINAL HERNIA REPAIR Right 08/03/2001   KNEE ARTHROSCOPY WITH ANTERIOR CRUCIATE LIGAMENT (ACL) REPAIR Right 07/08/2016   Procedure: RIGHT KNEE ARTHROSCOPY WITH ANTERIOR CRUCIATE LIGAMENT (ACL) REPAIR with patellar tendon autograft;  Surgeon: Frederico Hamman, MD;  Location: [facility];  Service: Orthopedics;  Laterality: Right;   ROOT CANAL Right        Home Medications    Prior to Admission medications   Medication Sig Start Date End Date Taking? Authorizing Provider  HYDROcodone-acetaminophen (NORCO/VICODIN) 5-325 MG tablet Take 1 tablet by mouth every 6 (six) hours as needed for severe pain or moderate pain. 08/10/21  Yes Wyndham Santilli G,  DO  tamsulosin (FLOMAX) 0.4 MG CAPS capsule Take 1 capsule (0.4 mg total) by mouth daily. 08/10/21  Yes Tommie Sams, DO    Family History Family History  Problem Relation Age of Onset   Alcohol abuse Mother    Drug abuse Mother    Depression Mother    Alcohol abuse Paternal Uncle    Prostate cancer Maternal Grandfather    Hypertension Maternal Grandfather    Diabetes Maternal Grandfather     Social History Social History   Tobacco Use   Smoking status: Never   Smokeless tobacco: Never  Vaping Use   Vaping Use: Every day  Substance Use Topics   Alcohol use: No    Alcohol/week: 0.0 standard drinks of alcohol   Drug use: Yes    Types: Marijuana     Allergies   Patient has no known allergies.   Review of Systems Review of Systems  Constitutional:  Positive for diaphoresis.  Gastrointestinal:  Positive for nausea.  Genitourinary:  Positive for flank pain.   Physical Exam Triage Vital Signs ED Triage Vitals  Enc Vitals Group     BP 08/10/21 1208 (!) 161/67     Pulse Rate 08/10/21 1208 62     Resp 08/10/21 1208 18     Temp --      Temp Source 08/10/21 1208 Oral     SpO2 08/10/21 1208 100 %     Weight 08/10/21 1205 200 lb (90.7 kg)  Height 08/10/21 1205 5\' 8"  (1.727 m)     Head Circumference --      Peak Flow --      Pain Score 08/10/21 1205 9     Pain Loc --      Pain Edu? --      Excl. in GC? --    Updated Vital Signs BP (!) 161/67 (BP Location: Left Arm)   Pulse 62   Resp 18   Ht 5\' 8"  (1.727 m)   Wt 90.7 kg   SpO2 100%   BMI 30.41 kg/m   Visual Acuity Right Eye Distance:   Left Eye Distance:   Bilateral Distance:    Right Eye Near:   Left Eye Near:    Bilateral Near:     Physical Exam Vitals and nursing note reviewed.  Constitutional:      Comments: In distress secondary to pain.  HENT:     Head: Normocephalic and atraumatic.  Eyes:     General:        Right eye: No discharge.        Left eye: No discharge.      Conjunctiva/sclera: Conjunctivae normal.  Cardiovascular:     Rate and Rhythm: Normal rate and regular rhythm.  Pulmonary:     Effort: Pulmonary effort is normal.     Breath sounds: Normal breath sounds. No wheezing, rhonchi or rales.  Abdominal:     Tenderness: There is right CVA tenderness.  Neurological:     Mental Status: He is alert.      UC Treatments / Results  Labs (all labs ordered are listed, but only abnormal results are displayed) Labs Reviewed  URINALYSIS, ROUTINE W REFLEX MICROSCOPIC - Abnormal; Notable for the following components:      Result Value   APPearance HAZY (*)    Specific Gravity, Urine >1.030 (*)    Hgb urine dipstick LARGE (*)    Bilirubin Urine SMALL (*)    Ketones, ur TRACE (*)    Protein, ur 30 (*)    All other components within normal limits  URINALYSIS, MICROSCOPIC (REFLEX) - Abnormal; Notable for the following components:   Bacteria, UA FEW (*)    All other components within normal limits    EKG   Radiology DG Abd 1 View  Result Date: 08/10/2021 CLINICAL DATA:  Right flank pain EXAM: ABDOMEN - 1 VIEW COMPARISON:  None Available. FINDINGS: Overall gas pattern is nonspecific. Small amount of stool is seen in the right colon. No abnormal masses or calcifications are seen. Kidneys are partly obscured by bowel contents. Visualized bony structures are unremarkable. IMPRESSION: Nonspecific bowel gas pattern. Small stool burden in the colon. No abnormal masses or calcifications are seen. Electronically Signed   By: M.D.   On: 08/10/2021 12:45    Procedures Procedures (including critical care time)  Medications Ordered in UC Medications  ketorolac (TORADOL) injection 60 mg (60 mg Intramuscular Given 08/10/21 1223)    Initial Impression / Assessment and Plan / UC Course  I have reviewed the triage vital signs and the nursing notes.  Pertinent labs & imaging results that were available during my care of the patient were  reviewed by me and considered in my medical decision making (see chart for details).    26 year old male presents with right-sided flank pain.  Urinalysis revealed large hemoglobin and concentrated urine.  Microscopy revealed amorphous crystals, calcium oxalate crystals.  KUB was obtained to assess for stone.  KUB was  independently interpreted by me.  Interpretation: Nonobstructive bowel gas pattern.  No appreciable evidence of kidney stone.  Patient's clinical picture is consistent with kidney stone although this was not seen on KUB.  Patient was given Toradol here today with improvement.  Discharging home on Flomax and hydrocodone.  [place] substance [place].  Patient is to increase his fluid intake.  He is to strain his urine.  If he worsens, he is to go to the hospital.  If he does not pass the stone in the near future he needs to [facility].  Final Clinical Impressions(s) / UC Diagnoses   Final diagnoses:  Flank pain     Discharge Instructions      Lots of fluids.  Medication as prescribed.  Strain urine.  If you do not pass stone in the next week, please call [facility] for an appt.  If you develop fever or your pain is not controlled, go to the hospital.   ED Prescriptions     Medication Sig Dispense Auth. Provider   HYDROcodone-acetaminophen (NORCO/VICODIN) 5-325 MG tablet Take 1 tablet by mouth every 6 (six) hours as needed for severe pain or moderate pain. 15 tablet Safiya Girdler G, DO   tamsulosin (FLOMAX) 0.4 MG CAPS capsule Take 1 capsule (0.4 mg total) by mouth daily. 30 capsule Everlene Otherook, Mouhamadou Gittleman G, DO      I have reviewed the PDMP during this encounter.   Tommie SamsCook, Amillion Macchia G, DO 08/10/21 1450

## 2021-08-10 NOTE — ED Triage Notes (Signed)
Pt c/o possible kidney stone x 1day.   Pt states that he was playing golf and the pain started. Pt drunk a drug of water and took a hot bath and the pain has gotten worse.   Pt states that his urine is dark yellow and denies any blood

## 2021-08-10 NOTE — Discharge Instructions (Signed)
Lots of fluids.  Medication as prescribed.  Strain urine.  If you do not pass stone in the next week, please call [facility] for an appt.  If you develop fever or your pain is not controlled, go to the hospital.

## 2021-08-19 ENCOUNTER — Encounter: Payer: Self-pay | Admitting: Urology

## 2021-08-19 ENCOUNTER — Ambulatory Visit: Payer: 59 | Admitting: Urology

## 2021-08-19 ENCOUNTER — Other Ambulatory Visit
Admission: RE | Admit: 2021-08-19 | Discharge: 2021-08-19 | Disposition: A | Discharge status: Home / Self Care | Payer: 59 | Attending: Urology | Admitting: Urology

## 2021-08-19 ENCOUNTER — Other Ambulatory Visit: Payer: Self-pay | Admitting: *Deleted

## 2021-08-19 VITALS — BP 120/80 | HR 62 | Ht 68.0 in | Wt 218.0 lb

## 2021-08-19 DIAGNOSIS — R310 Gross hematuria: Secondary | ICD-10-CM | POA: Diagnosis not present

## 2021-08-19 DIAGNOSIS — N200 Calculus of kidney: Secondary | ICD-10-CM | POA: Diagnosis not present

## 2021-08-19 DIAGNOSIS — R109 Unspecified abdominal pain: Secondary | ICD-10-CM

## 2021-08-19 DIAGNOSIS — Z842 Family history of other diseases of the genitourinary system: Secondary | ICD-10-CM | POA: Diagnosis not present

## 2021-08-19 LAB — URINALYSIS, COMPLETE (UACMP) WITH MICROSCOPIC
Bacteria, UA: NONE SEEN
Bilirubin Urine: NEGATIVE
Glucose, UA: NEGATIVE mg/dL
Hgb urine dipstick: NEGATIVE
Ketones, ur: NEGATIVE mg/dL
Leukocytes,Ua: NEGATIVE
Nitrite: NEGATIVE
Protein, ur: NEGATIVE mg/dL
RBC / HPF: NONE SEEN RBC/hpf (ref 0–5)
Specific Gravity, Urine: 1.02 (ref 1.005–1.030)
Squamous Epithelial / HPF: NONE SEEN (ref 0–5)
pH: 6.5 (ref 5.0–8.0)

## 2021-08-19 NOTE — Patient Instructions (Signed)
Dietary Guidelines to Help Prevent Kidney Stones Kidney stones are deposits of minerals and salts that form inside your kidneys. Your risk of developing kidney stones may be greater depending on your diet, your lifestyle, the medicines you take, and whether you have certain medical conditions. Most people can lower their chances of developing kidney stones by following the instructions below. Your dietitian may give you more specific instructions depending on your overall health and the type of kidney stones you tend to develop. What are tips for following this plan? Reading food labels  Choose foods with "no salt added" or "low-salt" labels. Limit your salt (sodium) intake to less than 1,500 mg a day. Choose foods with calcium for each meal and snack. Try to eat about 300 mg of calcium at each meal. Foods that contain 200-500 mg of calcium a serving include: 8 oz (237 mL) of milk, calcium-fortifiednon-dairy milk, and calcium-fortifiedfruit juice. Calcium-fortified means that calcium has been added to these drinks. 8 oz (237 mL) of kefir, yogurt, and soy yogurt. 4 oz (114 g) of tofu. 1 oz (28 g) of cheese. 1 cup (150 g) of dried figs. 1 cup (91 g) of cooked broccoli. One 3 oz (85 g) can of sardines or mackerel. Most people need 1,000-1,500 mg of calcium a day. Talk to your dietitian about how much calcium is recommended for you. Shopping Buy plenty of fresh fruits and vegetables. Most people do not need to avoid fruits and vegetables, even if these foods contain nutrients that may contribute to kidney stones. When shopping for convenience foods, choose: Whole pieces of fruit. Pre-made salads with dressing on the side. Low-fat fruit and yogurt smoothies. Avoid buying frozen meals or prepared deli foods. These can be high in sodium. Look for foods with live cultures, such as yogurt and kefir. Choose high-fiber grains, such as whole-wheat breads, oat bran, and wheat cereals. Cooking Do not add  salt to food when cooking. Place a salt shaker on the table and allow each person to add his or her own salt to taste. Use vegetable protein, such as beans, textured vegetable protein (TVP), or tofu, instead of meat in pasta, casseroles, and soups. Meal planning Eat less salt, if told by your dietitian. To do this: Avoid eating processed or pre-made food. Avoid eating fast food. Eat less animal protein, including cheese, meat, poultry, or fish, if told by your dietitian. To do this: Limit the number of times you have meat, poultry, fish, or cheese each week. Eat a diet free of meat at least 2 days a week. Eat only one serving each day of meat, poultry, fish, or seafood. When you prepare animal protein, cut pieces into small portion sizes. For most meat and fish, one serving is about the size of the palm of your hand. Eat at least five servings of fresh fruits and vegetables each day. To do this: Keep fruits and vegetables on hand for snacks. Eat one piece of fruit or a handful of berries with breakfast. Have a salad and fruit at lunch. Have two kinds of vegetables at dinner. Limit foods that are high in a substance called oxalate. These include: Spinach (cooked), rhubarb, beets, sweet potatoes, and Swiss chard. Peanuts. Potato chips, french fries, and baked potatoes with skin on. Nuts and nut products. Chocolate. If you regularly take a diuretic medicine, make sure to eat at least 1 or 2 servings of fruits or vegetables that are high in potassium each day. These include: Avocado. Banana. Orange, prune,   carrot, or tomato juice. Baked potato. Cabbage. Beans and split peas. Lifestyle  Drink enough fluid to keep your urine pale yellow. This is the most important thing you can do. Spread your fluid intake throughout the day. If you drink alcohol: Limit how much you use to: 0-1 drink a day for women who are not pregnant. 0-2 drinks a day for men. Be aware of how much alcohol is in your  drink. In the [place] one drink equals one 12 oz bottle of beer (355 mL), one 5 oz glass of wine (148 mL), or one 1 oz glass of hard liquor (44 mL). Lose weight if told by your health care provider. Work with your dietitian to find an eating plan and weight loss strategies that work best for you. General information Talk to your health care provider and dietitian about taking daily supplements. You may be told the following depending on your health and the cause of your kidney stones: Not to take supplements with vitamin C. To take a calcium supplement. To take a daily probiotic supplement. To take other supplements such as magnesium, fish oil, or vitamin B6. Take over-the-counter and prescription medicines only as told by your health care provider. These include supplements. What foods should I limit? Limit your intake of the following foods, or eat them as told by your dietitian. Vegetables Spinach. Rhubarb. Beets. Canned vegetables. Pickles. Olives. Baked potatoes with skin. Grains Wheat bran. Baked goods. Salted crackers. Cereals high in sugar. Meats and other proteins Nuts. Nut butters. Large portions of meat, poultry, or fish. Salted, precooked, or cured meats, such as sausages, meat loaves, and hot dogs. Dairy Cheese. Beverages Regular soft drinks. Regular vegetable juice. Seasonings and condiments Seasoning blends with salt. Salad dressings. Soy sauce. Ketchup. Barbecue sauce. Other foods Canned soups. Canned pasta sauce. Casseroles. Pizza. Lasagna. Frozen meals. Potato chips. French fries. The items listed above may not be a complete list of foods and beverages you should limit. Contact a dietitian for more information. What foods should I avoid? Talk to your dietitian about specific foods you should avoid based on the type of kidney stones you have and your overall health. Fruits Grapefruit. The item listed above may not be a complete list of foods and beverages you should  avoid. Contact a dietitian for more information. Summary Kidney stones are deposits of minerals and salts that form inside your kidneys. You can lower your risk of kidney stones by making changes to your diet. The most important thing you can do is drink enough fluid. Drink enough fluid to keep your urine pale yellow. Talk to your dietitian about how much calcium you should have each day, and eat less salt and animal protein as told by your dietitian. This information is not intended to replace advice given to you by your health care provider. Make sure you discuss any questions you have with your health care provider. Document Revised: 10/28/2020 Document Reviewed: 10/28/2020 Elsevier Patient Education  2023 Elsevier Inc.  

## 2021-08-19 NOTE — Progress Notes (Signed)
   08/19/21 1:09 PM   Ave Filter T Darr [birth year] [id number]  CC: Possible kidney stone  HPI: I saw Mr. Rhodus for possible kidney stone.  He presented to urgent care on 08/10/2021 with acute onset of severe right-sided flank pain and gross hematuria.  Urinalysis at that time showed greater than 50 RBCs, but KUB showed no definite evidence of stone.  His pain has resolved over the last few days and he really denies any complaints today.  He does have a family history of kidney stones in his father.  Urinalysis today is completely benign.   PMH: Past Medical History:  Diagnosis Date   GERD (gastroesophageal reflux disease)    no current med.   Right ACL tear 06/2016    Surgical History: Past Surgical History:  Procedure Laterality Date   ARM HARDWARE REMOVAL Right 01/18/2006   humerus   CLOSED REDUCTION AND PERCUTANEOUS PINNING OF HUMERUS FRACTURE Right 12/20/2005   INGUINAL HERNIA REPAIR Right 08/03/2001   KNEE ARTHROSCOPY WITH ANTERIOR CRUCIATE LIGAMENT (ACL) REPAIR Right 07/08/2016   Procedure: RIGHT KNEE ARTHROSCOPY WITH ANTERIOR CRUCIATE LIGAMENT (ACL) REPAIR with patellar tendon autograft;  Surgeon: Frederico Hamman, MD;  Location: [facility];  Service: Orthopedics;  Laterality: Right;   ROOT CANAL Right      Family History: Family History  Problem Relation Age of Onset   Alcohol abuse Mother    Drug abuse Mother    Depression Mother    Alcohol abuse Paternal Uncle    Prostate cancer Maternal Grandfather    Hypertension Maternal Grandfather    Diabetes Maternal Grandfather     Social History:  reports that he has never smoked. He has never been exposed to tobacco smoke. He has never used smokeless tobacco. He reports current drug use. Drug: Marijuana. He reports that he does not drink alcohol.  Physical Exam: BP 120/80   Pulse 62   Ht 5\' 8"  (1.727 m)   Wt 218 lb (98.9 kg)   BMI 33.15 kg/m    Constitutional:  Alert and oriented, No acute  distress. Cardiovascular: No clubbing, cyanosis, or edema. Respiratory: Normal respiratory effort, no increased work of breathing. GI: Abdomen is soft, nontender, nondistended, no abdominal masses   Laboratory Data: Reviewed  Pertinent Imaging: I have personally viewed and interpreted the KUB showing no definitive evidence of stones.  Assessment & Plan:   26 year old male with likely spontaneously passed kidney stone with resolved symptoms and benign urinalysis today.  We discussed alternative options like CT to confirm stone passage or evaluate for other pathology, but he defers at this time.  Return precautions were discussed extensively.  We discussed general stone prevention strategies including adequate hydration with goal of producing 2.5 L of urine daily, increasing citric acid intake, increasing calcium intake during high oxalate meals, minimizing animal protein, and decreasing salt intake. Information about dietary recommendations given today.   He prefers to follow-up with urology as needed  22, MD 08/19/2021  [facility] [address], [address] [place], [place] [phone]

## 2021-09-30 DIAGNOSIS — Z0289 Encounter for other administrative examinations: Secondary | ICD-10-CM | POA: Diagnosis not present

## 2024-05-30 IMAGING — CR DG ABDOMEN 1V
2 series · 2 of 2 positions shown · non-contrast
Comparison: None Available.

CLINICAL DATA: Right flank pain

EXAM:
ABDOMEN - 1 VIEW

[abdomen kub (1 of 2)]
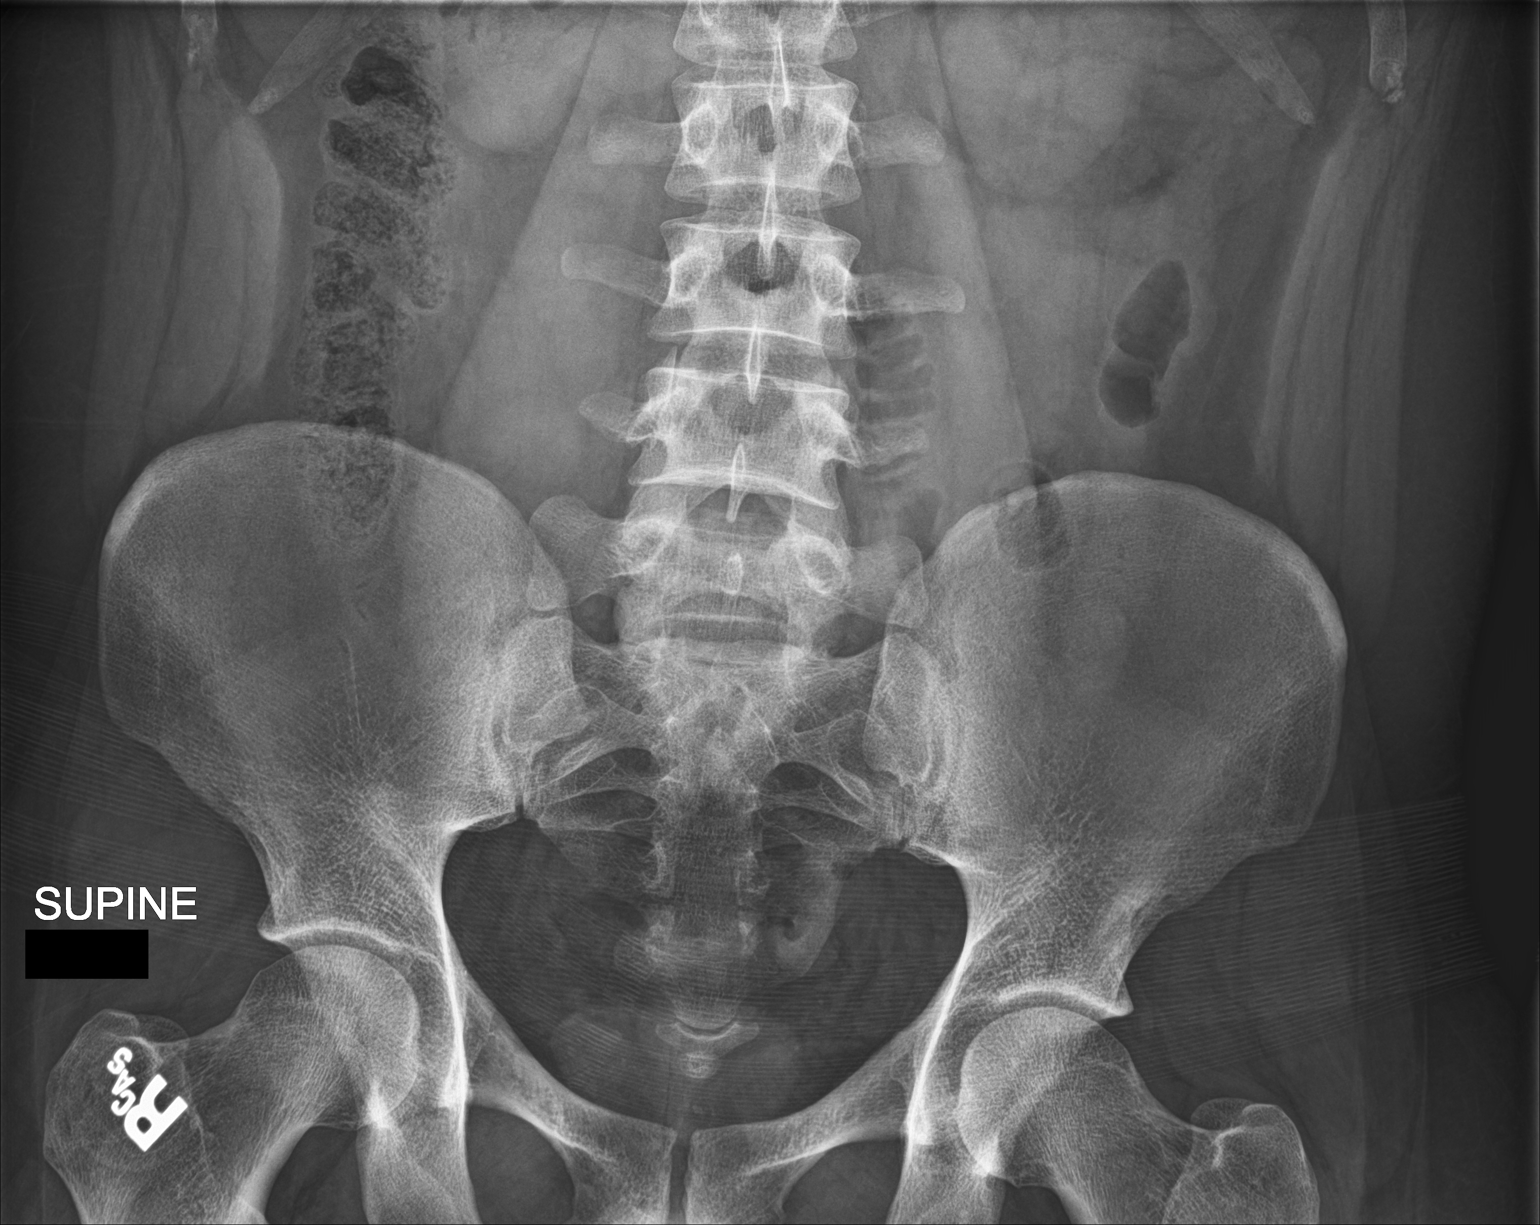

[abdomen kub (2 of 2)]
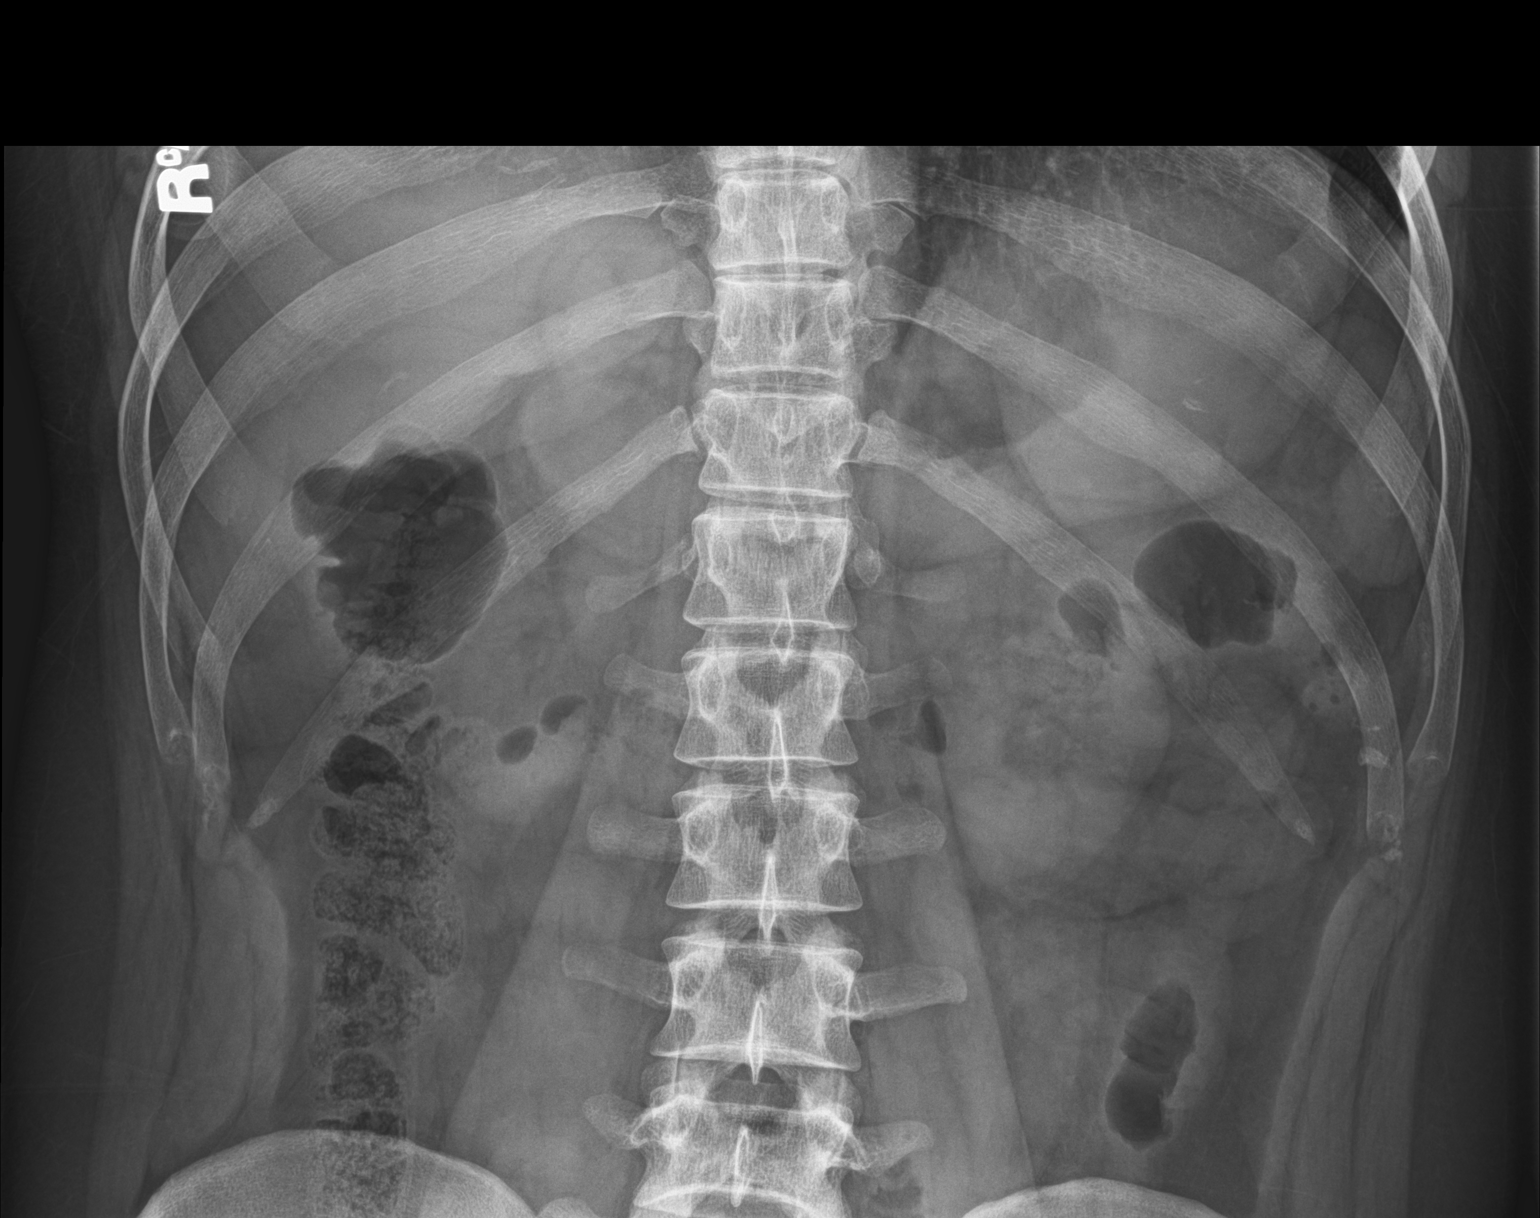

[2 of 2 positions shown; findings below may reference images not displayed]

FINDINGS: Overall gas pattern is nonspecific. Small amount of stool is seen in
the right colon. No abnormal masses or calcifications are seen.
Kidneys are partly obscured by bowel contents. Visualized bony
structures are unremarkable.
IMPRESSION: Nonspecific bowel gas pattern. Small stool burden in the colon. No
abnormal masses or calcifications are seen.
